# Patient Record
Sex: Female | Born: 1995 | Race: Black or African American | Hispanic: No | Marital: Single | Smoking: Never smoker
Health system: Southern US, Community
[De-identification: ages and names within clinical notes are randomized; demographics above are authoritative.]

## PROBLEM LIST (undated history)

## (undated) ENCOUNTER — Inpatient Hospital Stay (HOSPITAL_COMMUNITY): Payer: Self-pay

## (undated) DIAGNOSIS — R51 Headache: Secondary | ICD-10-CM

## (undated) DIAGNOSIS — R519 Headache, unspecified: Secondary | ICD-10-CM

## (undated) HISTORY — DX: Headache: R51

## (undated) HISTORY — PX: NO PAST SURGERIES: SHX2092

## (undated) HISTORY — DX: Headache, unspecified: R51.9

---

## 2014-08-15 ENCOUNTER — Emergency Department (HOSPITAL_COMMUNITY)
Admission: EM | Admit: 2014-08-15 | Discharge: 2014-08-16 | Disposition: A | Discharge status: Home / Self Care | Payer: Medicaid Other | Attending: Emergency Medicine | Admitting: Emergency Medicine

## 2014-08-15 ENCOUNTER — Encounter (HOSPITAL_COMMUNITY): Payer: Self-pay | Admitting: Emergency Medicine

## 2014-08-15 DIAGNOSIS — R202 Paresthesia of skin: Secondary | ICD-10-CM | POA: Insufficient documentation

## 2014-08-15 DIAGNOSIS — R002 Palpitations: Secondary | ICD-10-CM | POA: Insufficient documentation

## 2014-08-15 DIAGNOSIS — Z720 Tobacco use: Secondary | ICD-10-CM | POA: Insufficient documentation

## 2014-08-15 DIAGNOSIS — Z3202 Encounter for pregnancy test, result negative: Secondary | ICD-10-CM | POA: Insufficient documentation

## 2014-08-15 DIAGNOSIS — E669 Obesity, unspecified: Secondary | ICD-10-CM | POA: Insufficient documentation

## 2014-08-15 DIAGNOSIS — J029 Acute pharyngitis, unspecified: Secondary | ICD-10-CM | POA: Insufficient documentation

## 2014-08-15 LAB — URINALYSIS, ROUTINE W REFLEX MICROSCOPIC
BILIRUBIN URINE: NEGATIVE
GLUCOSE, UA: NEGATIVE mg/dL
HGB URINE DIPSTICK: NEGATIVE
KETONES UR: NEGATIVE mg/dL
Nitrite: NEGATIVE
PROTEIN: NEGATIVE mg/dL
Specific Gravity, Urine: 1.022 (ref 1.005–1.030)
Urobilinogen, UA: 1 mg/dL (ref 0.0–1.0)
pH: 5.5 (ref 5.0–8.0)

## 2014-08-15 LAB — CBC WITH DIFFERENTIAL/PLATELET
Basophils Absolute: 0 10*3/uL (ref 0.0–0.1)
Basophils Relative: 0 % (ref 0–1)
EOS ABS: 0 10*3/uL (ref 0.0–0.7)
Eosinophils Relative: 0 % (ref 0–5)
HCT: 40.6 % (ref 36.0–46.0)
HEMOGLOBIN: 13.4 g/dL (ref 12.0–15.0)
LYMPHS ABS: 2.2 10*3/uL (ref 0.7–4.0)
Lymphocytes Relative: 16 % (ref 12–46)
MCH: 30.7 pg (ref 26.0–34.0)
MCHC: 33 g/dL (ref 30.0–36.0)
MCV: 93.1 fL (ref 78.0–100.0)
MONOS PCT: 6 % (ref 3–12)
Monocytes Absolute: 0.8 10*3/uL (ref 0.1–1.0)
NEUTROS PCT: 78 % — AB (ref 43–77)
Neutro Abs: 10.6 10*3/uL — ABNORMAL HIGH (ref 1.7–7.7)
Platelets: 316 10*3/uL (ref 150–400)
RBC: 4.36 MIL/uL (ref 3.87–5.11)
RDW: 13.3 % (ref 11.5–15.5)
WBC: 13.6 10*3/uL — ABNORMAL HIGH (ref 4.0–10.5)

## 2014-08-15 LAB — COMPREHENSIVE METABOLIC PANEL
ALT: 19 U/L (ref 14–54)
AST: 19 U/L (ref 15–41)
Albumin: 3.7 g/dL (ref 3.5–5.0)
Alkaline Phosphatase: 94 U/L (ref 38–126)
Anion gap: 10 (ref 5–15)
BUN: 12 mg/dL (ref 6–20)
CALCIUM: 9.1 mg/dL (ref 8.9–10.3)
CO2: 24 mmol/L (ref 22–32)
Chloride: 103 mmol/L (ref 101–111)
Creatinine, Ser: 0.93 mg/dL (ref 0.44–1.00)
GFR calc Af Amer: >60 mL/min (ref >60)
GFR calc non Af Amer: >60 mL/min (ref >60)
GLUCOSE: 90 mg/dL (ref 65–99)
POTASSIUM: 4 mmol/L (ref 3.5–5.1)
Sodium: 137 mmol/L (ref 135–145)
TOTAL PROTEIN: 6.9 g/dL (ref 6.5–8.1)
Total Bilirubin: 0.4 mg/dL (ref 0.3–1.2)

## 2014-08-15 LAB — URINE MICROSCOPIC-ADD ON

## 2014-08-15 LAB — POC URINE PREG, ED: Preg Test, Ur: NEGATIVE

## 2014-08-15 MED ORDER — DEXAMETHASONE SODIUM PHOSPHATE 10 MG/ML IJ SOLN
10.0000 mg | Freq: Once | INTRAMUSCULAR | Status: DC
  Filled 2014-08-15: qty 1

## 2014-08-15 MED ORDER — ACETAMINOPHEN 325 MG PO TABS
650.0000 mg | ORAL_TABLET | Freq: Once | ORAL | Status: AC
  Administered 2014-08-16: 650 mg via ORAL
  Filled 2014-08-15: qty 2

## 2014-08-15 MED ORDER — METOCLOPRAMIDE HCL 5 MG/ML IJ SOLN
10.0000 mg | Freq: Once | INTRAMUSCULAR | Status: DC
  Filled 2014-08-15: qty 2

## 2014-08-15 MED ORDER — KETOROLAC TROMETHAMINE 30 MG/ML IJ SOLN
30.0000 mg | Freq: Once | INTRAMUSCULAR | Status: DC
  Filled 2014-08-15: qty 1

## 2014-08-15 NOTE — ED Notes (Signed)
Pt. reports dizziness/lightheaded with left upper arm tingling and mild blurred vision  onset today , denies fever or chills, no nausea or vomitting .

## 2014-08-15 NOTE — ED Provider Notes (Signed)
CSN: [id number]     Arrival date & time 08/15/14  2210 History   First MD Initiated Contact with Patient 08/15/14 2306     Chief Complaint  Patient presents with  . Dizziness  . Tingling     (Consider location/radiation/quality/duration/timing/severity/associated sxs/prior Treatment) Patient is a 19 y.o. female presenting with dizziness. The history is provided by the patient and a parent. No language interpreter was used.  Dizziness Associated symptoms: headaches   Associated symptoms: no chest pain   Shelly Watts is a 19 y.o female who presents with a headache and left arm tingling that began at 8:42pm while at work tonight.  She states that her throat hurts when she swallows. Mom states that she was complaining of her heart beating fast after work also.  She says her symptoms have since resolved but she still has a slight headache.  No prior treatment. She is not on birth control.  She denies any cough, shortness of breath, chest pain, abdominal pain, nausea, vomiting, or leg swelling.   History reviewed. No pertinent past medical history. History reviewed. No pertinent past surgical history. No family history on file. History  Substance Use Topics  . Smoking status: Current Some Day Smoker  . Smokeless tobacco: Not on file  . Alcohol Use: Yes   OB History    No data available     Review of Systems  HENT: Positive for sore throat.   Respiratory: Negative for cough.   Cardiovascular: Negative for chest pain.  Neurological: Positive for dizziness and headaches. Negative for syncope and light-headedness.  All other systems reviewed and are negative.     Allergies  Review of patient's allergies indicates no known allergies.  Home Medications   Prior to Admission medications   Not on File   BP 134/77 mmHg  Pulse 104  Temp(Src) 100.3 F (37.9 C) (Oral)  Resp 14  SpO2 100%  LMP 08/04/2014 Physical Exam  Constitutional: She is oriented to person, place, and time.  She appears well-developed and well-nourished.  Obese  HENT:  Mouth/Throat: Uvula is midline, oropharynx is clear and moist and mucous membranes are normal. No uvula swelling.  Bilateral tonsillar edema and erythema.  No kissing tonsils. No tonsillar abscess or exudates.  Eyes: Conjunctivae are normal.  Neck: Normal range of motion. Neck supple.  Cardiovascular: Normal rate and regular rhythm.   Pulmonary/Chest: Effort normal and breath sounds normal. No respiratory distress. She has no wheezes. She has no rales.  Abdominal: Soft. There is no tenderness.  Musculoskeletal: Normal range of motion.  Neurological: She is alert and oriented to person, place, and time. She has normal strength. No sensory deficit. GCS eye subscore is 4. GCS verbal subscore is 5. GCS motor subscore is 6.  Skin: Skin is warm and dry.    ED Course  Procedures (including critical care time) Labs Review Labs Reviewed  CBC WITH DIFFERENTIAL/PLATELET - Abnormal; Notable for the following:    WBC 13.6 (*)    Neutrophils Relative % 78 (*)    Neutro Abs 10.6 (*)    All other components within normal limits  URINALYSIS, ROUTINE W REFLEX MICROSCOPIC - Abnormal; Notable for the following:    APPearance CLOUDY (*)    Leukocytes, UA TRACE (*)    All other components within normal limits  URINE MICROSCOPIC-ADD ON - Abnormal; Notable for the following:    Squamous Epithelial / LPF FEW (*)    All other components within normal limits  URINE CULTURE  COMPREHENSIVE METABOLIC PANEL  TROPONIN I  POC URINE PREG, ED    Imaging Review No results found.   EKG Interpretation   Date/Time:  Thursday Aug 16 2014 00:24:09 EDT Ventricular Rate:  92 PR Interval:  140 QRS Duration: 83 QT Interval:  327 QTC Calculation: 404 R Axis:   6 Text Interpretation:  Sinus rhythm Low voltage, precordial leads nopt  Confirmed by MILLER  MD, Shelly ([id number]) on 08/16/2014 12:31:55 AM      MDM   Final diagnoses:  Viral pharyngitis   Palpitations  Patient presents for headache, palpitations, and left arm tingling while at work tonight.  She states her symptoms resolved soon after but mom was worried and brought her to the ED. Patient refused headache medications.  Her temp is 100.3 and she has a heart rate of 104.  Her labs are unremarkable and her EKG is not concerning. She is not dizzy now.  Patient is not drooling. No tonsillar abscess or hot potato voice. She non toxic appearing. I reviewed the CENTOR criteria.  I do not think she needs antibiotics. This is most likely viral. She can take tylenol for fever. I have given her [address] and wellness for follow up, patient and mom agree with plan.     Catha GosselinHanna Patel-Mills, PA-C 08/16/14 0036  Eber HongBrian Miller, MD 08/16/14 1026

## 2014-08-16 LAB — TROPONIN I: Troponin I: <0.03 ng/mL (ref <0.031)

## 2014-08-16 NOTE — ED Provider Notes (Signed)
The patient is a 19 year old female, reports having some palpitations earlier, dizziness earlier, denies any symptoms at this time, states that she feels totally normal. EKG is unremarkable, labs unremarkable, the patient was informed of these results. Her heart and lung sounds are normal, there is no abnormal palpitations or arrhythmia, clear lung sounds, well-appearing.   EKG Interpretation  Date/Time:  Thursday Aug 16 2014 00:24:09 EDT Ventricular Rate:  92 PR Interval:  140 QRS Duration: 83 QT Interval:  327 QTC Calculation: 404 R Axis:   6 Text Interpretation:  Sinus rhythm Low voltage, precordial leads nopt Confirmed by Sailor Hevia  MD, Vernie Piet ([id number]) on 08/16/2014 12:31:55 AM       Medical screening examination/treatment/procedure(s) were conducted as a shared visit with non-physician practitioner(s) and myself.  I personally evaluated the patient during the encounter.  Clinical Impression:   Final diagnoses:  Viral pharyngitis  Palpitations         Eber HongBrian Malissa Slay, MD 08/16/14 1027

## 2014-08-16 NOTE — ED Notes (Signed)
Pt refusing IV and IV medications. PA-C notified.

## 2014-08-16 NOTE — Discharge Instructions (Signed)

## 2014-08-17 LAB — URINE CULTURE: Colony Count: 35000

## 2015-12-10 ENCOUNTER — Ambulatory Visit: Payer: Self-pay | Admitting: Certified Nurse Midwife

## 2016-03-30 NOTE — L&D Delivery Note (Signed)
Delivery Note Shelly Watts is a 21 y.o. G1P1001 at 2177w4d admitted for advanced labor, 10cm in MAU/transferred to L&D.  Labor course: reports labor 'x 3 days', came in earlier today for labor eval, was 4cm and not making change so d/c'd home. States SROM thick mec app 1hr prior to arrival.  On 02/02/17 at 2328 a viable female was delivered via spontaneous vaginal delivery (Presentation: LOA) by Dr. Gwenevere GhaziKeriann Minott under my direct supervision.  Infant placed directly on mom's abdomen for bonding/skin-to-skin. Delayed cord clamping x 1min, then cord clamped x 2, and cut by pt's mom.  APGAR:9/10; weight: pending at time of note.  40 units of pitocin diluted in 1000cc LR was infused rapidly IV per protocol. The placenta separated spontaneously and delivered via CCT and maternal pushing effort.  It was inspected and appears to be intact with a 3 VC.  Placenta/Cord with the following complications: none .  Cord pH: not done  Intrapartum complications:  Precipitous delivery Anesthesia:  Local for repair Episiotomy: none Lacerations:  2nd degree perineal Suture Repair: 3.0 vicryl by me Est. Blood Loss (mL): 150ml Sponge and instrument count were correct x2.  Mom to postpartum.  Baby to Couplet care / Skin to Skin. Placenta to L&D. Plans to breastfeed Contraception: Nexplanon Circ: n/a  Marge DuncansBooker, Benno Brensinger Randall CNM, [facility] IncWHNP-BC 02/03/2017 12:58 AM   Grace BushyBooker, Merlene LaughterKimberly R, CNM  P [facility]        Please schedule this patient for PP visit in: 4 weeks  Low risk pregnancy complicated by: none  Delivery mode: SVD  Anticipated Birth Control: Nexplanon  PP Procedures needed: none  Schedule Integrated BH visit: no  Provider: Any provider

## 2016-08-09 ENCOUNTER — Inpatient Hospital Stay (HOSPITAL_COMMUNITY)
Admission: AD | Admit: 2016-08-09 | Discharge: 2016-08-10 | Disposition: A | Discharge status: Home / Self Care | Payer: Medicaid Other | Source: Ambulatory Visit | Attending: Obstetrics & Gynecology | Admitting: Obstetrics & Gynecology

## 2016-08-09 ENCOUNTER — Encounter (HOSPITAL_COMMUNITY): Payer: Self-pay

## 2016-08-09 DIAGNOSIS — Z3A14 14 weeks gestation of pregnancy: Secondary | ICD-10-CM | POA: Insufficient documentation

## 2016-08-09 DIAGNOSIS — R109 Unspecified abdominal pain: Secondary | ICD-10-CM | POA: Insufficient documentation

## 2016-08-09 DIAGNOSIS — O26892 Other specified pregnancy related conditions, second trimester: Secondary | ICD-10-CM | POA: Insufficient documentation

## 2016-08-09 DIAGNOSIS — O26899 Other specified pregnancy related conditions, unspecified trimester: Secondary | ICD-10-CM

## 2016-08-09 LAB — URINALYSIS, ROUTINE W REFLEX MICROSCOPIC
BILIRUBIN URINE: NEGATIVE
Glucose, UA: NEGATIVE mg/dL
HGB URINE DIPSTICK: NEGATIVE
Ketones, ur: NEGATIVE mg/dL
Leukocytes, UA: NEGATIVE
Nitrite: NEGATIVE
Protein, ur: NEGATIVE mg/dL
Specific Gravity, Urine: 1.018 (ref 1.005–1.030)
pH: 6 (ref 5.0–8.0)

## 2016-08-09 LAB — POCT PREGNANCY, URINE: Preg Test, Ur: POSITIVE — AB

## 2016-08-09 NOTE — MAU Note (Signed)
Pt here with abdominal pain for last two days, denies bleeding. Positive HPT two weeks. ago

## 2016-08-10 DIAGNOSIS — R109 Unspecified abdominal pain: Secondary | ICD-10-CM

## 2016-08-10 DIAGNOSIS — O26899 Other specified pregnancy related conditions, unspecified trimester: Secondary | ICD-10-CM

## 2016-08-10 MED ORDER — PRENATAL VITAMIN 27-0.8 MG PO TABS: 1.0000 | ORAL_TABLET | Freq: Every day | ORAL | 5 refills | Status: AC

## 2016-08-10 NOTE — Discharge Instructions (Signed)
Abdominal Pain During Pregnancy °Belly (abdominal) pain is common during pregnancy. Most of the time, it is not a serious problem. Other times, it can be a sign that something is wrong with the pregnancy. Always tell your doctor if you have belly pain. °Follow these instructions at home: °Monitor your belly pain for any changes. The following actions may help you feel better: °· Do not have sex (intercourse) or put anything in your vagina until you feel better. °· Rest until your pain stops. °· Drink clear fluids if you feel sick to your stomach (nauseous). Do not eat solid food until you feel better. °· Only take medicine as told by your doctor. °· Keep all doctor visits as told. °Get help right away if: °· You are bleeding, leaking fluid, or pieces of tissue come out of your vagina. °· You have more pain or cramping. °· You keep throwing up (vomiting). °· You have pain when you pee (urinate) or have blood in your pee. °· You have a fever. °· You do not feel your baby moving as much. °· You feel very weak or feel like passing out. °· You have trouble breathing, with or without belly pain. °· You have a very bad headache and belly pain. °· You have fluid leaking from your vagina and belly pain. °· You keep having watery poop (diarrhea). °· Your belly pain does not go away after resting, or the pain gets worse. °This information is not intended to replace advice given to you by your health care provider. Make sure you discuss any questions you have with your health care provider. °Document Released: 03/04/2009 Document Revised: 10/23/2015 Document Reviewed: 10/13/2012 °Elsevier Interactive Patient Education © 2017 [employer] ° °SAFE MEDICATIONS IN PREGNANCY ° °Acne:  °Benzoyl Peroxide  °Salicylic Acid  ° °Backache/Headache:  °Tylenol: 2 regular strength every 4 hours OR  °             2 Extra strength every 6 hours  ° °Colds/Coughs/Allergies:  °Benadryl (alcohol free) 25 mg every 6 hours as needed  °Breath right  strips  °Claritin  °Cepacol throat lozenges  °Chloraseptic throat spray  °Cold-Eeze- up to three times per day  °Cough drops, alcohol free  °Flonase (by prescription only)  °Guaifenesin  °Mucinex  °Robitussin DM (plain only, alcohol free)  °Saline nasal spray/drops  °Sudafed (pseudoephedrine) & Actifed * use only after [redacted] weeks gestation and if you do not have high blood pressure  °Tylenol  °Vicks Vaporub  °Zinc lozenges  °Zyrtec  ° °Constipation:  °Colace  °Ducolax suppositories  °Fleet enema  °Glycerin suppositories  °Metamucil  °Milk of magnesia  °Miralax  °Senokot  °Smooth move tea  ° °Diarrhea:  °Kaopectate  °Imodium A-D  ° °*NO pepto Bismol  ° °Hemorrhoids:  °Anusol  °Anusol HC  °Preparation H  °Tucks  ° °Indigestion:  °Tums  °Maalox  °Mylanta  °Zantac  °Pepcid  ° °Insomnia:  °Benadryl (alcohol free) 25mg every 6 hours as needed  °Tylenol PM  °Unisom, no Gelcaps  ° °Leg Cramps:  °Tums  °MagGel  ° °Nausea/Vomiting:  °Bonine  °Dramamine  °Emetrol  °Ginger extract  °Sea bands  °Meclizine  °Nausea medication to take during pregnancy:  °Unisom (doxylamine succinate 25 mg tablets) Take one tablet daily at bedtime. If symptoms are not adequately controlled, the dose can be increased to a maximum recommended dose of two tablets daily (1/2 tablet in the morning, 1/2 tablet mid-afternoon and one at bedtime).  °Vitamin B6 100mg tablets. Take one tablet twice a day (  up to 200 mg per day).   Skin Rashes:  Aveeno products  Benadryl cream or 25mg  every 6 hours as needed  Calamine Lotion  1% cortisone cream   Yeast infection:  Gyne-lotrimin 7  Monistat 7    **If taking multiple medications, please check labels to avoid duplicating the same active ingredients  **take medication as directed on the label  ** Do not exceed 4000 mg of tylenol in 24 hours  **Do not take medications that contain aspirin or ibuprofen

## 2016-08-10 NOTE — MAU Provider Note (Signed)
Chief Complaint: Abdominal Pain   SUBJECTIVE HPI: Shelly Watts is a 21 y.o. G1P0 at 343w2d who presents to Maternity Admissions reporting abdominal cramping while pregnant.  Started 3 days ago. Pain is described as cramping (like menstrual cramps), only located on left side, without any sharp or severe pain. Lasts a couple minutes, comes/goes within seconds, nothing brings it on. Denies VB. +Nausea but not vomiting, tolerating PO intake. Denies fevers, CP/SOB.  Not drinking a lot of water, mostly drinking juice and sodas. Having BMs daily, soft, no diarrhea. Denies any abnormal vaginal discharge.  Just found out she was pregnant [redacted] weeks ago, wasn't sure if she was actually pregnant, which is why she came to MAU. Has not started taking prenatal vitamins yet. Has not seen an OB yet.  LMP: Beginning of Feb, has irregular periods (does get them monthly, but just not exactly 28 days).     Past Medical History:  Diagnosis Date  . Medical history non-contributory    OB History  Gravida Para Term Preterm AB Living  1            SAB TAB Ectopic Multiple Live Births               # Outcome Date GA Lbr Len/2nd Weight Sex Delivery Anes PTL Lv  1 Current              Past Surgical History:  Procedure Laterality Date  . NO PAST SURGERIES     Social History   Social History  . Marital status: Single    Spouse name: N/A  . Number of children: N/A  . Years of education: N/A   Occupational History  . Not on file.   Social History Main Topics  . Smoking status: Never Smoker  . Smokeless tobacco: Never Used  . Alcohol use No     Comment: none since posiive  pregnancy test  . Drug use: No  . Sexual activity: Not on file   Other Topics Concern  . Not on file   Social History Narrative  . No narrative on file   No current facility-administered medications on file prior to encounter.    No current outpatient prescriptions on file prior to encounter.   No Known Allergies  I  have reviewed the past Medical Hx, Surgical Hx, Social Hx, Allergies and Medications.   REVIEW OF SYSTEMS  A comprehensive ROS was negative except per HPI.   OBJECTIVE Patient Vitals for the past 24 hrs:  BP Temp Temp src Pulse Resp SpO2 Height Weight  08/09/16 2312 119/74 98.5 F (36.9 C) Oral 91 18 99 % 5' (1.524 m) 236 lb (107 kg)    PHYSICAL EXAM Constitutional: Well-developed, well-nourished female in no acute distress.  Cardiovascular: normal rate, rhythm, no murmurs Respiratory: normal rate and effort. CTAB GI: Abd soft, non-tender, non-distended. Could not reproduce the pain. Patient pointed to where her pain has been located, pointed in the LLQ, not pelvic area. Pos BS x 4 MS: Extremities nontender, no edema, normal ROM Neurologic: Alert and oriented x 4.  GU: Neg CVAT. BIMANUAL: cervix closed/LONG/thick; uterus normal size, no adnexal tenderness or masses. No CMT.  LAB RESULTS Results for orders placed or performed during the hospital encounter of 08/09/16 (from the past 24 hour(s))  Urinalysis, Routine w reflex microscopic     Status: Abnormal   Collection Time: 08/09/16 11:15 PM  Result Value Ref Range   Color, Urine YELLOW YELLOW   APPearance HAZY (A)  CLEAR   Specific Gravity, Urine 1.018 1.005 - 1.030   pH 6.0 5.0 - 8.0   Glucose, UA NEGATIVE NEGATIVE mg/dL   Hgb urine dipstick NEGATIVE NEGATIVE   Bilirubin Urine NEGATIVE NEGATIVE   Ketones, ur NEGATIVE NEGATIVE mg/dL   Protein, ur NEGATIVE NEGATIVE mg/dL   Nitrite NEGATIVE NEGATIVE   Leukocytes, UA NEGATIVE NEGATIVE  Pregnancy, urine POC     Status: Abnormal   Collection Time: 08/09/16 11:37 PM  Result Value Ref Range   Preg Test, Ur POSITIVE (A) NEGATIVE    IMAGING No results found.  MAU COURSE SVE- closes/LONG/thick, normal white vaginal discharge on glove Doppler FHT 150s No pain on exam VSS UA clean Pos UPT   MDM Plan of care reviewed with patient, including labs and tests ordered and  medical treatment. Discussed to start taking prenatal vitamins, increase fluid intake and drink less soda and juice and more water. Warning signs/symptoms of miscarriage given. Likely cramping is due to bowel issues.    ASSESSMENT 1. Abdominal cramping affecting pregnancy     PLAN Discharge home in stable condition. Precautions for miscarriage given Follow up soon with OB of choice  Follow-up Information    OB Provider of your choice. Schedule an appointment as soon as possible for a visit in 1 week(s).   Why:  First prenatal visit Contact information: Please see handout of OB/GYN in the area         Allergies as of 08/10/2016   No Known Allergies     Medication List    TAKE these medications   Prenatal Vitamin 27-0.8 MG Tabs Take 1 tablet by mouth daily.        Jen Mow, DO OB Fellow 08/10/2016 1:15 AM

## 2016-09-17 ENCOUNTER — Ambulatory Visit: Payer: Self-pay | Admitting: Obstetrics

## 2016-10-02 ENCOUNTER — Encounter: Payer: Self-pay | Admitting: Obstetrics

## 2016-10-02 ENCOUNTER — Other Ambulatory Visit (HOSPITAL_COMMUNITY)
Admission: RE | Admit: 2016-10-02 | Discharge: 2016-10-02 | Disposition: A | Discharge status: Home / Self Care | Payer: Medicaid Other | Source: Ambulatory Visit | Attending: Obstetrics | Admitting: Obstetrics

## 2016-10-02 ENCOUNTER — Ambulatory Visit (INDEPENDENT_AMBULATORY_CARE_PROVIDER_SITE_OTHER): Payer: Medicaid Other | Admitting: Obstetrics

## 2016-10-02 VITALS — BP 121/74 | HR 95 | Temp 98.3°F | Wt 249.9 lb

## 2016-10-02 DIAGNOSIS — Z6841 Body Mass Index (BMI) 40.0 and over, adult: Secondary | ICD-10-CM

## 2016-10-02 DIAGNOSIS — Z3401 Encounter for supervision of normal first pregnancy, first trimester: Secondary | ICD-10-CM

## 2016-10-02 DIAGNOSIS — Z3400 Encounter for supervision of normal first pregnancy, unspecified trimester: Secondary | ICD-10-CM | POA: Insufficient documentation

## 2016-10-02 DIAGNOSIS — Z3402 Encounter for supervision of normal first pregnancy, second trimester: Secondary | ICD-10-CM | POA: Insufficient documentation

## 2016-10-02 DIAGNOSIS — O0930 Supervision of pregnancy with insufficient antenatal care, unspecified trimester: Secondary | ICD-10-CM

## 2016-10-02 NOTE — Progress Notes (Signed)
Subjective:    Shelly Watts is being seen today for her first obstetrical visit.  This is not a planned pregnancy. She is at 4052w6d gestation. Her obstetrical history is significant for obesity. Relationship with FOB: significant other, not living together. Patient does intend to breast feed. Pregnancy history fully reviewed.  The information documented in the HPI was reviewed and verified.  Menstrual History: OB History    Gravida Para Term Preterm AB Living   1 0           SAB TAB Ectopic Multiple Live Births                  Patient's last menstrual period was 05/02/2016 (approximate).    Past Medical History:  Diagnosis Date  . Headache   . Medical history non-contributory     Past Surgical History:  Procedure Laterality Date  . NO PAST SURGERIES       (Not in a hospital admission) No Known Allergies  Social History  Substance Use Topics  . Smoking status: Never Smoker  . Smokeless tobacco: Never Used  . Alcohol use No     Comment: none since posiive  pregnancy test    Family History  Problem Relation Age of Onset  . Miscarriages / [place] Mother   . Asthma Brother   . Heart disease Maternal Grandmother   . Hypertension Maternal Grandmother   . Diabetes Maternal Grandfather   . Hypertension Maternal Grandfather      Review of Systems Constitutional: negative for weight loss Gastrointestinal: negative for vomiting Genitourinary:negative for genital lesions and vaginal discharge and dysuria Musculoskeletal:negative for back pain Behavioral/Psych: negative for abusive relationship, depression, illegal drug usage and tobacco use    Objective:    BP 121/74   Pulse 95   Temp 98.3 F (36.8 C)   Wt 249 lb 14.4 oz (113.4 kg)   LMP 05/02/2016 (Approximate)   BMI 48.81 kg/m  General Appearance:    Alert, cooperative, no distress, appears stated age  Head:    Normocephalic, without obvious abnormality, atraumatic  Eyes:    PERRL, conjunctiva/corneas  clear, EOM's intact, fundi    benign, both eyes  Ears:    Normal TM's and external ear canals, both ears  Nose:   Nares normal, septum midline, mucosa normal, no drainage    or sinus tenderness  Throat:   Lips, mucosa, and tongue normal; teeth and gums normal  Neck:   Supple, symmetrical, trachea midline, no adenopathy;    thyroid:  no enlargement/tenderness/nodules; no carotid   bruit or JVD  Back:     Symmetric, no curvature, ROM normal, no CVA tenderness  Lungs:     Clear to auscultation bilaterally, respirations unlabored  Chest Wall:    No tenderness or deformity   Heart:    Regular rate and rhythm, S1 and S2 normal, no murmur, rub   or gallop  Breast Exam:    No tenderness, masses, or nipple abnormality  Abdomen:     Soft, non-tender, bowel sounds active all four quadrants,    no masses, no organomegaly  Genitalia:    Normal female without lesion, discharge or tenderness  Extremities:   Extremities normal, atraumatic, no cyanosis or edema  Pulses:   2+ and symmetric all extremities  Skin:   Skin color, texture, turgor normal, no rashes or lesions  Lymph nodes:   Cervical, supraclavicular, and axillary nodes normal  Neurologic:   CNII-XII intact, normal strength, sensation and reflexes  throughout      Lab Review Urine pregnancy test Labs reviewed yes Radiologic studies reviewed yes  Assessment:    Pregnancy at [redacted]w[redacted]d weeks    Plan:     1. Supervision of normal first pregnancy, antepartum Rx: - Culture, OB Urine - [place] MFM OB COMP + 14 WK; Future  2. Late prenatal care affecting pregnancy, antepartum   3. Class 3 severe obesity due to excess calories without serious comorbidity with body mass index (BMI) of 45.0 to 49.9 in adult [facility])  Prenatal vitamins.  Counseling provided regarding continued use of seat belts, cessation of alcohol consumption, smoking or use of illicit drugs; infection precautions i.e., influenza/TDAP immunizations, toxoplasmosis,CMV, parvovirus,  listeria and varicella; workplace safety, exercise during pregnancy; routine dental care, safe medications, sexual activity, hot tubs, saunas, pools, travel, caffeine use, fish and methlymercury, potential toxins, hair treatments, varicose veins Weight gain recommendations per IOM guidelines reviewed: underweight/BMI< 18.5--> gain 28 - 40 lbs; normal weight/BMI 18.5 - 24.9--> gain 25 - 35 lbs; overweight/BMI 25 - 29.9--> gain 15 - 25 lbs; obese/BMI >30->gain  11 - 20 lbs Problem list reviewed and updated. FIRST/CF mutation testing/NIPT/QUAD SCREEN/fragile X/Ashkenazi Jewish population testing/Spinal muscular atrophy discussed: requested. Role of ultrasound in pregnancy discussed; fetal survey: requested. Amniocentesis discussed: not indicated. VBAC calculator score: VBAC consent form provided No orders of the defined types were placed in this encounter.  Orders Placed This Encounter  Procedures  . Culture, OB Urine  . [place] MFM OB COMP + 14 WK    Standing Status:   Future    Standing Expiration Date:   12/03/2017    Order Specific Question:   Reason for Exam (SYMPTOM  OR DIAGNOSIS REQUIRED)    Answer:   Anatomy    Order Specific Question:   Preferred imaging location?    Answer:   [facility]-Ultrasound    Follow up in 4 weeks. 50% of 20 min visit spent on counseling and coordination of care. Patient ID: Shelly Monks, female   DOB: 1995/05/13, 21 y.o.   MRN: [id number]

## 2016-10-02 NOTE — Progress Notes (Signed)
New OB late to care at 21.6 weeks

## 2016-10-03 NOTE — Progress Notes (Signed)
Patient did not show for visit.  Shelly Nishida A. Clearance CootsHarper MD

## 2016-10-04 LAB — CULTURE, OB URINE

## 2016-10-04 LAB — URINE CULTURE, OB REFLEX

## 2016-10-05 LAB — HEMOGLOBINOPATHY EVALUATION
HEMOGLOBIN A2 QUANTITATION: 2.5 % (ref 1.8–3.2)
HEMOGLOBIN F QUANTITATION: 0 % (ref 0.0–2.0)
HGB A: 97.5 % (ref 96.4–98.8)
HGB C: 0 %
HGB S: 0 %
HGB VARIANT: 0 %

## 2016-10-05 LAB — OBSTETRIC PANEL, INCLUDING HIV
Antibody Screen: NEGATIVE
Basophils Absolute: 0 10*3/uL (ref 0.0–0.2)
Basos: 0 %
EOS (ABSOLUTE): 0.1 10*3/uL (ref 0.0–0.4)
Eos: 1 %
HEMOGLOBIN: 12.2 g/dL (ref 11.1–15.9)
HIV Screen 4th Generation wRfx: NONREACTIVE
Hematocrit: 36.7 % (ref 34.0–46.6)
Hepatitis B Surface Ag: NEGATIVE
IMMATURE GRANS (ABS): 0 10*3/uL (ref 0.0–0.1)
IMMATURE GRANULOCYTES: 0 %
LYMPHS: 19 %
Lymphocytes Absolute: 1.6 10*3/uL (ref 0.7–3.1)
MCH: 31.3 pg (ref 26.6–33.0)
MCHC: 33.2 g/dL (ref 31.5–35.7)
MCV: 94 fL (ref 79–97)
MONOCYTES: 8 %
MONOS ABS: 0.6 10*3/uL (ref 0.1–0.9)
Neutrophils Absolute: 6 10*3/uL (ref 1.4–7.0)
Neutrophils: 72 %
Platelets: 274 10*3/uL (ref 150–379)
RBC: 3.9 x10E6/uL (ref 3.77–5.28)
RDW: 14.5 % (ref 12.3–15.4)
RPR Ser Ql: NONREACTIVE
Rh Factor: POSITIVE
Rubella Antibodies, IGG: 2.35 index (ref >0.99)
WBC: 8.3 10*3/uL (ref 3.4–10.8)

## 2016-10-05 LAB — CERVICOVAGINAL ANCILLARY ONLY
Bacterial vaginitis: NEGATIVE
Candida vaginitis: NEGATIVE
Chlamydia: NEGATIVE
Neisseria Gonorrhea: NEGATIVE
TRICH (WINDOWPATH): NEGATIVE

## 2016-10-05 LAB — VARICELLA ZOSTER ANTIBODY, IGG: VARICELLA: 210 {index} (ref >165)

## 2016-10-06 LAB — CYTOLOGY - PAP

## 2016-10-08 LAB — AFP TETRA
DIA MOM VALUE: 0.5
DIA Value (EIA): 95.53 pg/mL
DSR (By Age)    1 IN: 1124
DSR (SECOND TRIMESTER) 1 IN: 10000
Gestational Age: 21.6 WEEKS
MSAFP Mom: 1.23
MSAFP: 70.7 ng/mL
MSHCG Mom: 1.31
MSHCG: 22063 m[IU]/mL
Maternal Age At EDD: 22.2 yr
OSB RISK: 10000
T18 (By Age): 1:4377 {titer}
Test Results:: NEGATIVE
UE3 MOM: 1.65
WEIGHT: 241 [lb_av]
uE3 Value: 3.28 ng/mL

## 2016-10-08 LAB — CYSTIC FIBROSIS MUTATION 97

## 2016-10-09 ENCOUNTER — Other Ambulatory Visit: Payer: Self-pay | Admitting: Obstetrics

## 2016-10-09 DIAGNOSIS — Z3A23 23 weeks gestation of pregnancy: Secondary | ICD-10-CM

## 2016-10-09 DIAGNOSIS — Z3689 Encounter for other specified antenatal screening: Secondary | ICD-10-CM

## 2016-10-09 DIAGNOSIS — O99212 Obesity complicating pregnancy, second trimester: Secondary | ICD-10-CM

## 2016-10-12 ENCOUNTER — Ambulatory Visit (HOSPITAL_COMMUNITY)
Admission: RE | Admit: 2016-10-12 | Discharge: 2016-10-12 | Disposition: A | Discharge status: Home / Self Care | Payer: Medicaid Other | Source: Ambulatory Visit | Attending: Obstetrics | Admitting: Obstetrics

## 2016-10-12 DIAGNOSIS — O99212 Obesity complicating pregnancy, second trimester: Secondary | ICD-10-CM | POA: Diagnosis present

## 2016-10-12 DIAGNOSIS — Z3689 Encounter for other specified antenatal screening: Secondary | ICD-10-CM | POA: Diagnosis not present

## 2016-10-12 DIAGNOSIS — Z3A23 23 weeks gestation of pregnancy: Secondary | ICD-10-CM | POA: Insufficient documentation

## 2016-10-13 ENCOUNTER — Other Ambulatory Visit: Payer: Self-pay | Admitting: Obstetrics

## 2016-10-13 DIAGNOSIS — Z13228 Encounter for screening for other metabolic disorders: Secondary | ICD-10-CM

## 2016-10-30 ENCOUNTER — Ambulatory Visit (INDEPENDENT_AMBULATORY_CARE_PROVIDER_SITE_OTHER): Payer: Self-pay | Admitting: Obstetrics

## 2016-10-30 ENCOUNTER — Encounter: Payer: Self-pay | Admitting: *Deleted

## 2016-10-30 ENCOUNTER — Encounter: Payer: Self-pay | Admitting: Obstetrics

## 2016-10-30 DIAGNOSIS — Z3402 Encounter for supervision of normal first pregnancy, second trimester: Secondary | ICD-10-CM

## 2016-10-30 DIAGNOSIS — Z3400 Encounter for supervision of normal first pregnancy, unspecified trimester: Secondary | ICD-10-CM

## 2016-10-30 NOTE — Progress Notes (Signed)
Patient reports good fetal movement, denies pain. 

## 2016-10-30 NOTE — Progress Notes (Signed)
Subjective:  Shelly Watts is a 21 y.o. G1P0 at 5233w6d being seen today for ongoing prenatal care.  She is currently monitored for the following issues for this low-risk pregnancy and has Supervision of normal first pregnancy, antepartum on her problem list.  Patient reports no complaints.  Contractions: Not present. Vag. Bleeding: None.  Movement: Present. Denies leaking of fluid.   The following portions of the patient's history were reviewed and updated as appropriate: allergies, current medications, past family history, past medical history, past social history, past surgical history and problem list. Problem list updated.  Objective:   Vitals:   10/30/16 1041  BP: 104/70  Pulse: 85    Fetal Status: Fetal Heart Rate (bpm): 150   Movement: Present     General:  Alert, oriented and cooperative. Patient is in no acute distress.  Skin: Skin is warm and dry. No rash noted.   Cardiovascular: Normal heart rate noted  Respiratory: Normal respiratory effort, no problems with respiration noted  Abdomen: Soft, gravid, appropriate for gestational age. Pain/Pressure: Absent     Pelvic:  Cervical exam deferred        Extremities: Normal range of motion.  Edema: Trace  Mental Status: Normal mood and affect. Normal behavior. Normal judgment and thought content.   Urinalysis:      Assessment and Plan:  Pregnancy: G1P0 at 6033w6d  1. Supervision of normal first pregnancy, antepartum   Preterm labor symptoms and general obstetric precautions including but not limited to vaginal bleeding, contractions, leaking of fluid and fetal movement were reviewed in detail with the patient. Please refer to After Visit Summary for other counseling recommendations.  Return in about 2 weeks (around 11/13/2016) for ROB, 2 hour OGTT.   Brock BadHarper, Charles A, MD

## 2016-11-03 ENCOUNTER — Ambulatory Visit (HOSPITAL_COMMUNITY)
Admission: RE | Admit: 2016-11-03 | Discharge: 2016-11-03 | Disposition: A | Discharge status: Home / Self Care | Payer: Medicaid Other | Source: Ambulatory Visit | Attending: Obstetrics | Admitting: Obstetrics

## 2016-11-03 DIAGNOSIS — Z141 Cystic fibrosis carrier: Principal | ICD-10-CM

## 2016-11-03 DIAGNOSIS — Z3A26 26 weeks gestation of pregnancy: Secondary | ICD-10-CM | POA: Insufficient documentation

## 2016-11-03 DIAGNOSIS — O09899 Supervision of other high risk pregnancies, unspecified trimester: Secondary | ICD-10-CM | POA: Insufficient documentation

## 2016-11-03 NOTE — Progress Notes (Signed)
Genetic Counseling  High-Risk Gestation Note  Appointment Date:  11/03/2016 Referred By: Shelly Bombard, MD Date of Birth:  June 30, 1995   Pregnancy History: G1P0 Estimated Date of Delivery: 02/06/17 Estimated Gestational Age: 52w3dAttending: JJolyn Lent MD  I met with Ms. 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     In summary:  Discussed cystic fibrosis and autosomal recessive inheritance  Reviewed the specific variant identified in Ms. WDoylestownwith 7T/7T- range of consequences for patients with this variant in combination with another pathogenic variant  Discussed availability of carrier screening for the father of the baby  Reviewed limitations of screening  Father of baby not currently available for carrier screening  Reviewed risks to the fetus prior to carrier screening for the father of the baby  Discussed option of prenatal diagnosis only when mutations have been identified in both parents  Declined amniocentesis at this time  Understand limitation of ultrasound in diagnosis of cf  Patient is comfortable with CF assessment as part of newborn screening  No other family history concerns   We reviewed the results of Ms. Frankye WCaliforniaCF carrier screening.  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 CF carrier screening has not yet been performed for her the father of the pregnancy.  Ms. WSutphenreported no additional relatives known to be CF carriers and no known relatives with cystic fibrosis.  The father of the pregnancy reportedly has no known individuals with cystic fibrosis in his family history, and consanguinity to Ms. Thersia WCaliforniawas denied. He reportedly has [place], Caucasian, and Native American ancestry. Ms. WYokleyreported another potential father of the pregnancy  who also has no known family history of CF, no known consanguinity to her and has African American ancestry.   We spent time discussing cystic fibrosis (CF).  Classic features of CF include thickened secretions in the lungs, digestive and reproductive systems. This life-limiting condition is characterized by chronic respiratory infections requiring daily chest therapies and pancreatic dysfunction disrupting the body's ability to break down food and extract nutrients as it should, which may restrict growth. Infertility commonly occurs in males. With therapies, such as daily respiratory therapies and medications to aid digestion, the median lifespan for people with CF is now mid-40's. We discussed that more recent therapies include CFTR modulator therapies, which are medications designed to correct the function of the defective protein made by CFTR. Treatment may involve lung transplantation in some cases. There can be significant variability in the severity of symptoms and expression of the disease. Expression of the disease depends upon the specific mutations present in an individual with CF.   We spent time reviewing genes and the autosomal recessive inheritance of CF. CF is a common genetic condition in the Caucasian population occurring in approximately 1 in 358,300Caucasian births. This means approximately 1 in 29 Caucasians is a CF carrier, and approximately 1 in 638individuals of African American ancestry are CF carriers. We discussed that individuals who are carriers have one copy of the CFTR gene with a disease causing mutation, and their other CFTR gene copy functions correctly. Thus, carriers typically do not have associated medical symptoms. We discussed that when both parents are carriers for CF, each pregnancy has an independent chance for one of the following outcomes: a 25% chance to inherit both mutations and thus have CF;  a 50% chance to inherit one gene mutation and be a carrier similar to  parents; and a 25% chance to be neither a carrier nor have CF. When one parent is a CF carrier but the other is not, then each pregnancy has a 1 in 2 chance to be a CF carrier but would not be expected to be at increased risk to inherit CF.   We spent some time specifically discussing Shelly Watts's specific CF gene mutation. R117H is a gene mutation, which is known to produce a CFTR protein with reduced chloride transport. The intron 8 polythymidine (poly T) sequence in the gene influences the severity of the CF phenotype when R117H is found in conjunction with a severe CF mutation. The thymidines are found in sequences of 5(5T), 7 (7T), or 9 (9T) repeats. R117H on a 5T background is acknowledged as a disease producing CFTR mutation and in combination with a severe mutation (for example, [id number]) generally results in pancreatic sufficient CF. There is less known about R117H on a 7T background, which is the particular gene change identified for Shelly Watts. In one series, subjects were asymptomatic or males had absent vas deferens only. A more recent series of patients known to CF clinics through clinical presentation or new born screening included a small number of adults with adult onset CF disease. It is possible, however, that compound heterozygotes with R117H on a 7T background remain asymptomatic and are never tested. The range of possible phenotypes associated with the R117H mutation highlights the importance of obtaining the intron 8 polythymidine sequence prior to predicting the severity of the phenotype in an R117H compound heterozygote. The difficulty lies in the certainty of the information provided to parents and patients; in particular those with R117H on a 7T background (Curnow, 2003). Given that Shelly Watts has R117H/7T allele, in the case that the father of the pregnancy also has a CFTR mutation, there would be a 1 in 4 chance for the baby to inherit both alleles, but  that child would not be expected to have classic CF; the combination of a classic CFTR mutation with R117H; 7T is more likely to be associated with CBAVD (congenital bilateral absence of the vas deferens) in males, mild lung disease in some cases, or absence of symptoms.    There are known to be thousands of mutations which can cause the CFTR gene to not function properly. Carrier screening is available to assess for the most common disease causing mutations. However, carrier screening does not identify all CF carriers. Thus, a negative CF carrier screen would reduce, but not eliminate, the chance to be a CF carrier and thus the chance for CF in a pregnancy.  Detection rate for CF carrier screening depends in part upon the methodology of the lab. Given that the father of the pregnancy reportedly has no known family history of CF, he would have the general population chance to be a carrier, or approximately up to 1 in 29, prior to carrier screening. Thus, prior to carrier screening for the father of the pregnancy, the chance for an affected pregnancy is approximately 1 in 116 (0.9%).  We discussed that CF carrier screening for the father of the pregnancy would further refine the risk for CF in the current pregnancy. Shelly Watts reported that the father of the pregnancy is currently unavailable for CF carrier screening, given that he is incarcerated.   We reviewed that when both parents are identified to be CF carriers,  prenatal diagnosis via amniocentesis would be available, if desired. The risks, benefits, and limitations of amniocentesis were reviewed. Shelly Watts stated that she is not interested in amniocentesis in the pregnancy. A fetus with cystic fibrosis typically appears normal on targeted ultrasound, although rarely echogenic bowel is visualized. However, the presence of echogenic bowel on targeted ultrasound is not diagnostic for CF in a pregnancy, nor does the absence of echogenic bowel on  ultrasound rule out CF in the pregnancy. We discussed that postnatal testing for CF can also be performed for babies identified to be at risk to inherit CF. She understands that in [place], the newborn screening test will detect CF, but that carriers may come back as false positives.  Both family histories were reviewed and were otherwise contributory for cerebral palsy in the patient's maternal half-siblings; twin brother and sister. Shelly Watts reported that these twin half-siblings were born at [redacted] weeks gestation. The female twin has an unstable gait, and the female twin is not able to walk and has also required VP shunt.  Cerebral palsy (CP) is a group of clinical syndromes that range in severity, characterized by abnormal muscle tone, posture, and movement. Cerebral palsy is due to abnormalities in the developing brain resulting from a variety of causes. The etiology is reported to be multifactorial, with most cases typically due to prenatal factors.  Prematurity is the most common association, but in many cases, no cause is identified. A specific genetic cause for cerebral palsy has not been identified, and underlying genetic disorders are relatively uncommon in individuals with CP.  Given the reported family history, recurrence risk for the current pregnancy would likely be low. Additional information regarding this individual's underlying condition or etiology may alter recurrence risk assessment. Without further information regarding the provided family history, an accurate genetic risk cannot be calculated. Further genetic counseling is warranted if more information is obtained.  Shelly Watts Eagen denied exposure to environmental toxins or chemical agents. She denied the use of alcohol, tobacco or street drugs. She denied significant viral illnesses during the course of her pregnancy. Her medical and surgical histories were noncontributory.   I counseled Ms. Andrews regarding the  above risks and available options.  The approximate face-to-face time with the genetic counselor was 35 minutes.  Chipper Oman, MS Certified Genetic Counselor 11/03/2016

## 2016-11-25 ENCOUNTER — Encounter: Payer: Self-pay | Admitting: Obstetrics

## 2016-11-25 ENCOUNTER — Ambulatory Visit (INDEPENDENT_AMBULATORY_CARE_PROVIDER_SITE_OTHER): Payer: Medicaid Other | Admitting: Obstetrics

## 2016-11-25 ENCOUNTER — Other Ambulatory Visit: Payer: Medicaid Other

## 2016-11-25 VITALS — BP 110/73 | HR 94 | Wt 252.8 lb

## 2016-11-25 DIAGNOSIS — Z3400 Encounter for supervision of normal first pregnancy, unspecified trimester: Secondary | ICD-10-CM

## 2016-11-25 DIAGNOSIS — Z6841 Body Mass Index (BMI) 40.0 and over, adult: Secondary | ICD-10-CM

## 2016-11-25 DIAGNOSIS — O99213 Obesity complicating pregnancy, third trimester: Secondary | ICD-10-CM

## 2016-11-25 DIAGNOSIS — O0933 Supervision of pregnancy with insufficient antenatal care, third trimester: Secondary | ICD-10-CM

## 2016-11-25 DIAGNOSIS — O0930 Supervision of pregnancy with insufficient antenatal care, unspecified trimester: Secondary | ICD-10-CM

## 2016-11-25 NOTE — Progress Notes (Signed)
Subjective:  Shelly Watts is a 21 y.o. G1P0 at 7733w4d being seen today for ongoing prenatal care.  She is currently monitored for the following issues for this low-risk pregnancy and has Supervision of normal first pregnancy, antepartum; Cystic fibrosis carrier, antepartum; and [redacted] weeks gestation of pregnancy on her problem list.  Patient reports breast nipple pain and burning sensation.  Contractions: Not present. Vag. Bleeding: None.  Movement: Present. Denies leaking of fluid.   The following portions of the patient's history were reviewed and updated as appropriate: allergies, current medications, past family history, past medical history, past social history, past surgical history and problem list. Problem list updated.  Objective:   Vitals:   11/25/16 1058  BP: 110/73  Pulse: 94  Weight: 252 lb 12.8 oz (114.7 kg)    Fetal Status: Fetal Heart Rate (bpm): 150   Movement: Present     General:  Alert, oriented and cooperative. Patient is in no acute distress.  Skin: Skin is warm and dry. No rash noted.   Cardiovascular: Normal heart rate noted  Respiratory: Normal respiratory effort, no problems with respiration noted  Abdomen: Soft, gravid, appropriate for gestational age. Pain/Pressure: Absent     Pelvic:  Cervical exam deferred        Extremities: Normal range of motion.  Edema: None  Mental Status: Normal mood and affect. Normal behavior. Normal judgment and thought content.   Urinalysis:      Assessment and Plan:  Pregnancy: G1P0 at 6133w4d  1. Supervision of normal first pregnancy, antepartum   2. 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 Late prenatal care affecting pregnancy, antepartum   Preterm labor symptoms and general obstetric precautions including but not limited to vaginal bleeding, contractions, leaking of fluid and fetal movement were reviewed in detail with the  patient. Please refer to After Visit Summary for other counseling recommendations.  Return in about 2 weeks (around 12/09/2016) for ROB.   Brock BadHarper, Iori Gigante A, MD

## 2016-11-25 NOTE — Progress Notes (Signed)
Patient is concerned about some symptoms she is having with her breast- she is having bilateral nipple pain and tenderness..Shelly Watts

## 2016-11-26 ENCOUNTER — Other Ambulatory Visit: Payer: Medicaid Other

## 2016-11-26 DIAGNOSIS — Z3400 Encounter for supervision of normal first pregnancy, unspecified trimester: Secondary | ICD-10-CM

## 2016-11-27 LAB — CBC
Hematocrit: 36.6 % (ref 34.0–46.6)
Hemoglobin: 12.2 g/dL (ref 11.1–15.9)
MCH: 32.4 pg (ref 26.6–33.0)
MCHC: 33.3 g/dL (ref 31.5–35.7)
MCV: 97 fL (ref 79–97)
PLATELETS: 278 10*3/uL (ref 150–379)
RBC: 3.77 x10E6/uL (ref 3.77–5.28)
RDW: 14 % (ref 12.3–15.4)
WBC: 8.4 10*3/uL (ref 3.4–10.8)

## 2016-11-27 LAB — GLUCOSE TOLERANCE, 2 HOURS W/ 1HR
GLUCOSE, 1 HOUR: 98 mg/dL (ref 65–179)
Glucose, 2 hour: 85 mg/dL (ref 65–152)
Glucose, Fasting: 74 mg/dL (ref 65–91)

## 2016-11-27 LAB — HIV ANTIBODY (ROUTINE TESTING W REFLEX): HIV Screen 4th Generation wRfx: NONREACTIVE

## 2016-11-27 LAB — SYPHILIS: RPR W/REFLEX TO RPR TITER AND TREPONEMAL ANTIBODIES, TRADITIONAL SCREENING AND DIAGNOSIS ALGORITHM: RPR Ser Ql: NONREACTIVE

## 2016-12-21 ENCOUNTER — Encounter: Payer: Self-pay | Admitting: Obstetrics

## 2016-12-22 ENCOUNTER — Encounter: Payer: Self-pay | Admitting: Obstetrics

## 2016-12-22 ENCOUNTER — Ambulatory Visit (INDEPENDENT_AMBULATORY_CARE_PROVIDER_SITE_OTHER): Payer: Medicaid Other | Admitting: Obstetrics

## 2016-12-22 VITALS — BP 114/75 | HR 84 | Wt 252.0 lb

## 2016-12-22 DIAGNOSIS — Z3689 Encounter for other specified antenatal screening: Secondary | ICD-10-CM

## 2016-12-22 DIAGNOSIS — B369 Superficial mycosis, unspecified: Secondary | ICD-10-CM

## 2016-12-22 DIAGNOSIS — Z6841 Body Mass Index (BMI) 40.0 and over, adult: Secondary | ICD-10-CM

## 2016-12-22 DIAGNOSIS — Z3403 Encounter for supervision of normal first pregnancy, third trimester: Secondary | ICD-10-CM

## 2016-12-22 DIAGNOSIS — Z3400 Encounter for supervision of normal first pregnancy, unspecified trimester: Secondary | ICD-10-CM

## 2016-12-22 MED ORDER — CLOTRIMAZOLE 1 % EX CREA: 1.0000 "application " | TOPICAL_CREAM | Freq: Two times a day (BID) | CUTANEOUS | 1 refills | Status: DC

## 2016-12-22 NOTE — Progress Notes (Signed)
Subjective:  Shelly Watts is a 21 y.o. G1P0 at [redacted]w[redacted]d being seen today for ongoing prenatal care.  She is currently monitored for the following issues for this low-risk pregnancy and has Supervision of normal first pregnancy, antepartum; Cystic fibrosis carrier, antepartum; and [redacted] weeks gestation of pregnancy on her problem list.  Patient reports rash on chest and arms.  Contractions: Not present. Vag. Bleeding: None.  Movement: Present. Denies leaking of fluid.   The following portions of the patient's history were reviewed and updated as appropriate: allergies, current medications, past family history, past medical history, past social history, past surgical history and problem list. Problem list updated.  Objective:   Vitals:   12/22/16 1335  BP: 114/75  Pulse: 84  Weight: 252 lb (114.3 kg)    Fetal Status:     Movement: Present     General:  Alert, oriented and cooperative. Patient is in no acute distress.  Skin: Skin is warm and dry. No rash noted.   Cardiovascular: Normal heart rate noted  Respiratory: Normal respiratory effort, no problems with respiration noted  Abdomen: Soft, gravid, appropriate for gestational age. Pain/Pressure: Present     Pelvic:  Cervical exam deferred        Extremities: Normal range of motion.  Edema: None  Mental Status: Normal mood and affect. Normal behavior. Normal judgment and thought content.   Urinalysis:      Assessment and Plan:  Pregnancy: G1P0 at [redacted]w[redacted]d  1. Supervision of normal first pregnancy, antepartum  2. Class 3 severe obesity due to excess calories without serious comorbidity with body mass index (BMI) of 45.0 to 49.9 in adult [facility]) Rx: - [place] MFM OB FOLLOW UP; Future  3. Encounter for ultrasound to assess interval growth of fetus Rx: - [place] MFM OB FOLLOW UP; Future  4. Superficial fungus infection of skin Rx: - clotrimazole (LOTRIMIN) 1 % cream; Apply 1 application topically 2 (two) times daily.  Dispense: 113 g; Refill:  1  Preterm labor symptoms and general obstetric precautions including but not limited to vaginal bleeding, contractions, leaking of fluid and fetal movement were reviewed in detail with the patient. Please refer to After Visit Summary for other counseling recommendations.  Return in about 2 weeks (around 01/05/2017) for ROB.  GBS.Marland Kitchen   Brock Bad, MD

## 2016-12-22 NOTE — Progress Notes (Signed)
Patient is having pain in pelvis area and sometimes numbness in legs. Patient has a rash on chest, arms and nack- it has been present since pregnancy- but spreading for 2 weeks. It is itching. Dry, flaky patches [place] will help symptoms- but it is getting worse.

## 2016-12-30 ENCOUNTER — Ambulatory Visit (HOSPITAL_COMMUNITY)
Admission: RE | Admit: 2016-12-30 | Discharge: 2016-12-30 | Disposition: A | Discharge status: Home / Self Care | Payer: Medicaid Other | Source: Ambulatory Visit | Attending: Obstetrics | Admitting: Obstetrics

## 2016-12-30 ENCOUNTER — Encounter (HOSPITAL_COMMUNITY): Payer: Self-pay

## 2016-12-30 ENCOUNTER — Other Ambulatory Visit: Payer: Self-pay | Admitting: Obstetrics

## 2016-12-30 DIAGNOSIS — Z362 Encounter for other antenatal screening follow-up: Secondary | ICD-10-CM | POA: Insufficient documentation

## 2016-12-30 DIAGNOSIS — O99213 Obesity complicating pregnancy, third trimester: Secondary | ICD-10-CM | POA: Insufficient documentation

## 2016-12-30 DIAGNOSIS — Z3A34 34 weeks gestation of pregnancy: Secondary | ICD-10-CM | POA: Insufficient documentation

## 2016-12-30 DIAGNOSIS — Z6841 Body Mass Index (BMI) 40.0 and over, adult: Secondary | ICD-10-CM

## 2016-12-30 DIAGNOSIS — Z3400 Encounter for supervision of normal first pregnancy, unspecified trimester: Secondary | ICD-10-CM

## 2016-12-30 DIAGNOSIS — Z141 Cystic fibrosis carrier: Secondary | ICD-10-CM

## 2016-12-30 DIAGNOSIS — O09899 Supervision of other high risk pregnancies, unspecified trimester: Secondary | ICD-10-CM

## 2016-12-30 DIAGNOSIS — Z3689 Encounter for other specified antenatal screening: Secondary | ICD-10-CM

## 2017-01-05 ENCOUNTER — Encounter: Payer: Self-pay | Admitting: Obstetrics

## 2017-01-05 ENCOUNTER — Ambulatory Visit (INDEPENDENT_AMBULATORY_CARE_PROVIDER_SITE_OTHER): Payer: Medicaid Other | Admitting: Obstetrics

## 2017-01-05 ENCOUNTER — Other Ambulatory Visit (HOSPITAL_COMMUNITY)
Admission: RE | Admit: 2017-01-05 | Discharge: 2017-01-05 | Disposition: A | Discharge status: Home / Self Care | Payer: Medicaid Other | Source: Ambulatory Visit | Attending: Obstetrics | Admitting: Obstetrics

## 2017-01-05 VITALS — BP 126/76 | HR 96 | Wt 256.0 lb

## 2017-01-05 DIAGNOSIS — Z3403 Encounter for supervision of normal first pregnancy, third trimester: Secondary | ICD-10-CM

## 2017-01-05 LAB — OB RESULTS CONSOLE GC/CHLAMYDIA: Gonorrhea: NEGATIVE

## 2017-01-05 NOTE — Progress Notes (Signed)
Subjective:  Shelly Watts is a 21 y.o. G1P0 at [redacted]w[redacted]d being seen today for ongoing prenatal care.  She is currently monitored for the following issues for this low-risk pregnancy and has Supervision of normal first pregnancy, antepartum; Cystic fibrosis carrier, antepartum; and [redacted] weeks gestation of pregnancy on her problem list.  Patient reports no complaints.  Contractions: Not present. Vag. Bleeding: None.  Movement: Present. Denies leaking of fluid.   The following portions of the patient's history were reviewed and updated as appropriate: allergies, current medications, past family history, past medical history, past social history, past surgical history and problem list. Problem list updated.  Objective:   Vitals:   01/05/17 1130  BP: 126/76  Pulse: 96  Weight: 256 lb (116.1 kg)    Fetal Status: Fetal Heart Rate (bpm): 150   Movement: Present     General:  Alert, oriented and cooperative. Patient is in no acute distress.  Skin: Skin is warm and dry. No rash noted.   Cardiovascular: Normal heart rate noted  Respiratory: Normal respiratory effort, no problems with respiration noted  Abdomen: Soft, gravid, appropriate for gestational age. Pain/Pressure: Absent     Pelvic:  Cervical exam deferred        Extremities: Normal range of motion.     Mental Status: Normal mood and affect. Normal behavior. Normal judgment and thought content.   Urinalysis:      Assessment and Plan:  Pregnancy: G1P0 at [redacted]w[redacted]d  1. Encounter for supervision of normal first pregnancy in third trimester Rx: - Strep Gp B NAA - Cervicovaginal ancillary only  Preterm labor symptoms and general obstetric precautions including but not limited to vaginal bleeding, contractions, leaking of fluid and fetal movement were reviewed in detail with the patient. Please refer to After Visit Summary for other counseling recommendations.  Return in about 1 week (around 01/12/2017) for ROB.   Brock Bad, MD

## 2017-01-06 LAB — CERVICOVAGINAL ANCILLARY ONLY
Bacterial vaginitis: NEGATIVE
CANDIDA VAGINITIS: NEGATIVE
Chlamydia: NEGATIVE
Neisseria Gonorrhea: NEGATIVE
Trichomonas: NEGATIVE

## 2017-01-07 LAB — STREP GP B NAA: Strep Gp B NAA: NEGATIVE

## 2017-01-12 ENCOUNTER — Encounter: Payer: Self-pay | Admitting: Obstetrics

## 2017-01-12 ENCOUNTER — Ambulatory Visit (INDEPENDENT_AMBULATORY_CARE_PROVIDER_SITE_OTHER): Payer: Medicaid Other | Admitting: Obstetrics

## 2017-01-12 DIAGNOSIS — Z3400 Encounter for supervision of normal first pregnancy, unspecified trimester: Secondary | ICD-10-CM

## 2017-01-12 DIAGNOSIS — Z3403 Encounter for supervision of normal first pregnancy, third trimester: Secondary | ICD-10-CM

## 2017-01-12 NOTE — Progress Notes (Signed)
Pt states she is doing well at this time.

## 2017-01-12 NOTE — Progress Notes (Signed)
Subjective:  Stefan Karen is a 21 y.o. G1P0 at [redacted]w[redacted]d being seen today for ongoing prenatal care.  She is currently monitored for the following issues for this low-risk pregnancy and has Supervision of normal first pregnancy, antepartum; Cystic fibrosis carrier, antepartum; and [redacted] weeks gestation of pregnancy on her problem list.  Patient reports no complaints.  Contractions: Irritability. Vag. Bleeding: None.  Movement: Present. Denies leaking of fluid.   The following portions of the patient's history were reviewed and updated as appropriate: allergies, current medications, past family history, past medical history, past social history, past surgical history and problem list. Problem list updated.  Objective:   Vitals:   01/12/17 1608  BP: 114/74  Pulse: 91  Weight: 256 lb (116.1 kg)    Fetal Status:     Movement: Present     General:  Alert, oriented and cooperative. Patient is in no acute distress.  Skin: Skin is warm and dry. No rash noted.   Cardiovascular: Normal heart rate noted  Respiratory: Normal respiratory effort, no problems with respiration noted  Abdomen: Soft, gravid, appropriate for gestational age. Pain/Pressure: Absent     Pelvic:  Cervical exam deferred        Extremities: Normal range of motion.  Edema: Mild pitting, slight indentation  Mental Status: Normal mood and affect. Normal behavior. Normal judgment and thought content.   Urinalysis:      Assessment and Plan:  Pregnancy: G1P0 at [redacted]w[redacted]d  1. Supervision of normal first pregnancy, antepartum   Preterm labor symptoms and general obstetric precautions including but not limited to vaginal bleeding, contractions, leaking of fluid and fetal movement were reviewed in detail with the patient. Please refer to After Visit Summary for other counseling recommendations.  Return in about 1 week (around 01/19/2017) for ROB.   Brock Bad, MD

## 2017-01-21 ENCOUNTER — Encounter: Payer: Medicaid Other | Admitting: Obstetrics

## 2017-01-22 ENCOUNTER — Ambulatory Visit (INDEPENDENT_AMBULATORY_CARE_PROVIDER_SITE_OTHER): Payer: Medicaid Other | Admitting: Obstetrics

## 2017-01-22 ENCOUNTER — Encounter: Payer: Self-pay | Admitting: Obstetrics

## 2017-01-22 DIAGNOSIS — Z3400 Encounter for supervision of normal first pregnancy, unspecified trimester: Secondary | ICD-10-CM

## 2017-01-22 DIAGNOSIS — Z3403 Encounter for supervision of normal first pregnancy, third trimester: Secondary | ICD-10-CM

## 2017-01-22 NOTE — Progress Notes (Signed)
Subjective:  Shelly Watts is a 21 y.o. G1P0 at 2171w1d being seen today for ongoing prenatal care.  She is currently monitored for the following issues for this low-risk pregnancy and has Supervision of normal first pregnancy, antepartum; Cystic fibrosis carrier, antepartum; and [redacted] weeks gestation of pregnancy on her problem list.  Patient reports no complaints.  Contractions: Irritability. Vag. Bleeding: None.  Movement: Present. Denies leaking of fluid.   The following portions of the patient's history were reviewed and updated as appropriate: allergies, current medications, past family history, past medical history, past social history, past surgical history and problem list. Problem list updated.  Objective:   Vitals:   01/22/17 0923  BP: 120/84  Pulse: 89  Weight: 262 lb 6.4 oz (119 kg)    Fetal Status: Fetal Heart Rate (bpm): 150   Movement: Present     General:  Alert, oriented and cooperative. Patient is in no acute distress.  Skin: Skin is warm and dry. No rash noted.   Cardiovascular: Normal heart rate noted  Respiratory: Normal respiratory effort, no problems with respiration noted  Abdomen: Soft, gravid, appropriate for gestational age. Pain/Pressure: Absent     Pelvic:  Cervical exam deferred        Extremities: Normal range of motion.  Edema: Trace  Mental Status: Normal mood and affect. Normal behavior. Normal judgment and thought content.   Urinalysis:      Assessment and Plan:  Pregnancy: G1P0 at 7071w1d  1. Supervision of normal first pregnancy, antepartum   Preterm labor symptoms and general obstetric precautions including but not limited to vaginal bleeding, contractions, leaking of fluid and fetal movement were reviewed in detail with the patient. Please refer to After Visit Summary for other counseling recommendations.  Return in about 1 week (around 01/29/2017) for ROB.   Brock BadHarper, Charles A, MD

## 2017-01-22 NOTE — Progress Notes (Signed)
Pt states she is doing well at this time. 

## 2017-01-27 ENCOUNTER — Encounter (HOSPITAL_COMMUNITY): Payer: Self-pay | Admitting: *Deleted

## 2017-01-27 ENCOUNTER — Inpatient Hospital Stay (HOSPITAL_COMMUNITY)
Admission: AD | Admit: 2017-01-27 | Discharge: 2017-01-27 | Disposition: A | Discharge status: Home / Self Care | Payer: Medicaid Other | Source: Ambulatory Visit | Attending: Family Medicine | Admitting: Family Medicine

## 2017-01-27 DIAGNOSIS — Z3A38 38 weeks gestation of pregnancy: Secondary | ICD-10-CM | POA: Diagnosis not present

## 2017-01-27 DIAGNOSIS — O26893 Other specified pregnancy related conditions, third trimester: Secondary | ICD-10-CM | POA: Insufficient documentation

## 2017-01-27 DIAGNOSIS — Z825 Family history of asthma and other chronic lower respiratory diseases: Secondary | ICD-10-CM | POA: Insufficient documentation

## 2017-01-27 DIAGNOSIS — R109 Unspecified abdominal pain: Secondary | ICD-10-CM

## 2017-01-27 NOTE — MAU Provider Note (Signed)
History     CSN: [id number]  Arrival date and time: 01/27/17 1191   First Provider Initiated Contact with Patient 01/27/17 2015      Chief Complaint  Patient presents with  . Abdominal Pain   HPI  [name] is a 21 y.o. G1P0 at [redacted]w[redacted]d gestation presenting to MAU with complaints of abdominal pain to the right of her belly button. She states the pain worsens when she stands up and eases off when she sits down.  She describes the pain as outward pressure.  She denies contractions, VB or LOF.  She reports good (+) FM.  Past Medical History:  Diagnosis Date  . Headache   . Medical history non-contributory     Past Surgical History:  Procedure Laterality Date  . NO PAST SURGERIES      Family History  Problem Relation Age of Onset  . Miscarriages / [place] Mother   . Asthma Brother   . Heart disease Maternal Grandmother   . Hypertension Maternal Grandmother   . Diabetes Maternal Grandfather   . Hypertension Maternal Grandfather     Social History  Substance Use Topics  . Smoking status: Never Smoker  . Smokeless tobacco: Never Used  . Alcohol use No     Comment: none since posiive  pregnancy test    Allergies: No Known Allergies  Prescriptions Prior to Admission  Medication Sig Dispense Refill Last Dose  . Prenatal Vit-Fe Fumarate-FA (PRENATAL VITAMIN) 27-0.8 MG TABS Take 1 tablet by mouth daily. 90 tablet 5 01/26/2017 at Unknown time  . clotrimazole (LOTRIMIN) 1 % cream Apply 1 application topically 2 (two) times daily. 113 g 1 Taking    Review of Systems  Constitutional: Negative.   HENT: Negative.   Eyes: Negative.   Respiratory: Negative.   Cardiovascular: Negative.   Gastrointestinal: Positive for abdominal pain (outward pressure). Negative for diarrhea, nausea and vomiting.  Endocrine: Negative.   Genitourinary: Negative.   Musculoskeletal: Negative.   Skin: Negative.   Allergic/Immunologic: Negative.   Neurological: Negative.    Hematological: Negative.   Psychiatric/Behavioral: Negative.    Physical Exam   Blood pressure 123/79, pulse 83, temperature (!) 97.4 F (36.3 C), temperature source Oral, resp. rate 19, weight 265 lb 12.8 oz (120.6 kg), last menstrual period 05/02/2016.  Physical Exam  Nursing note and vitals reviewed. Constitutional: She is oriented to person, place, and time. She appears well-developed and well-nourished.  HENT:  Head: Normocephalic.  Eyes: Pupils are equal, round, and reactive to light.  Neck: Normal range of motion.  Cardiovascular: Normal rate, regular rhythm and normal heart sounds.   Respiratory: Effort normal and breath sounds normal.  GI: Soft. Bowel sounds are normal.  Musculoskeletal: Normal range of motion.  Neurological: She is alert and oriented to person, place, and time.  Skin: Skin is warm and dry.  Psychiatric: She has a normal mood and affect. Her behavior is normal. Judgment and thought content normal.   Dilation: Closed Effacement (%): Thick Cervical Position: Posterior Station: Ballotable Presentation: Vertex Exam by:: Carloyn Jaeger, CNM  MAU Course  Procedures  MDM NST - FHR: 130 bpm / moderate variability / accels present / decels absent / TOCO: none  Assessment and Plan  Abdominal pain during pregnancy in third trimester - Advised to soak in warm tub of water for about 20 minutes, take Tylenol 1000 mg and Benadryl 25 mg tonight before bed. - Keep scheduled appt on 11/1 with [facility] - Labor precautions given  Discharge home  Patient verbalized an understanding of the plan of care and agrees.    Raelyn Moraolitta Rumeal Cullipher, MSN, CNM 01/27/2017, 8:20 PM

## 2017-01-27 NOTE — MAU Note (Signed)
Pt presents to MAU c/o abdominal pain. Pt states the pain is right around her belly button. Pt states when she is standing she has a constant pain that will peak at times and then will "fall back" pt states when she sits down it kind of mellows out. Pt denies LOF or bleeding. Pt states good fetal movement. Pt denies any other complaints at this time.

## 2017-01-27 NOTE — Discharge Instructions (Signed)
Fetal Movement Counts Patient Name: ________________________________________________ Patient Due Date: ____________________ What is a fetal movement count? A fetal movement count is the number of times that you feel your baby move during a certain amount of time. This may also be called a fetal kick count. A fetal movement count is recommended for every pregnant woman. You may be asked to start counting fetal movements as early as week 28 of your pregnancy. Pay attention to when your baby is most active. You may notice your baby's sleep and wake cycles. You may also notice things that make your baby move more. You should do a fetal movement count:  When your baby is normally most active.  At the same time each day.  A good time to count movements is while you are resting, after having something to eat and drink. How do I count fetal movements? 1. Find a quiet, comfortable area. Sit, or lie down on your side. 2. Write down the date, the start time and stop time, and the number of movements that you felt between those two times. Take this information with you to your health care visits. 3. For 2 hours, count kicks, flutters, swishes, rolls, and jabs. You should feel at least 10 movements during 2 hours. 4. You may stop counting after you have felt 10 movements. 5. If you do not feel 10 movements in 2 hours, have something to eat and drink. Then, keep resting and counting for 1 hour. If you feel at least 4 movements during that hour, you may stop counting. Contact a health care provider if:  You feel fewer than 4 movements in 2 hours.  Your baby is not moving like he or she usually does. Date: ____________ Start time: ____________ Stop time: ____________ Movements: ____________ Date: ____________ Start time: ____________ Stop time: ____________ Movements: ____________ Date: ____________ Start time: ____________ Stop time: ____________ Movements: ____________ Date: ____________ Start time:  ____________ Stop time: ____________ Movements: ____________ Date: ____________ Start time: ____________ Stop time: ____________ Movements: ____________ Date: ____________ Start time: ____________ Stop time: ____________ Movements: ____________ Date: ____________ Start time: ____________ Stop time: ____________ Movements: ____________ Date: ____________ Start time: ____________ Stop time: ____________ Movements: ____________ Date: ____________ Start time: ____________ Stop time: ____________ Movements: ____________ This information is not intended to replace advice given to you by your health care provider. Make sure you discuss any questions you have with your health care provider. Document Released: 04/15/2006 Document Revised: 11/13/2015 Document Reviewed: 04/25/2015 Elsevier Interactive Patient Education  2018 [employer] You can soak in a tub of water water for about 20 minutes, take Tylenol 1000 mg and Benadryl 25 mg tonight before bed.

## 2017-01-28 ENCOUNTER — Ambulatory Visit (INDEPENDENT_AMBULATORY_CARE_PROVIDER_SITE_OTHER): Payer: Medicaid Other | Admitting: Certified Nurse Midwife

## 2017-01-28 ENCOUNTER — Encounter: Payer: Self-pay | Admitting: Certified Nurse Midwife

## 2017-01-28 VITALS — BP 118/87 | HR 111 | Wt 268.5 lb

## 2017-01-28 DIAGNOSIS — Z3400 Encounter for supervision of normal first pregnancy, unspecified trimester: Secondary | ICD-10-CM

## 2017-01-28 NOTE — Progress Notes (Signed)
   PRENATAL VISIT NOTE  Subjective:  Shelly Watts is a 21 y.o. G1P0 at 9217w0d being seen today for ongoing prenatal care.  She is currently monitored for the following issues for this low-risk pregnancy and has Supervision of normal first pregnancy, antepartum; Cystic fibrosis carrier, antepartum; [redacted] weeks gestation of pregnancy; and Abdominal pain during pregnancy in third trimester on her problem list.  Patient reports no complaints.  Contractions: Irritability. Vag. Bleeding: None.  Movement: Present. Denies leaking of fluid.   The following portions of the patient's history were reviewed and updated as appropriate: allergies, current medications, past family history, past medical history, past social history, past surgical history and problem list. Problem list updated.  Objective:   Vitals:   01/28/17 1524  BP: 118/87  Pulse: (!) 111  Weight: 268 lb 8 oz (121.8 kg)    Fetal Status: Fetal Heart Rate (bpm): 142; doppler Fundal Height: 40 cm Movement: Present     General:  Alert, oriented and cooperative. Patient is in no acute distress.  Skin: Skin is warm and dry. No rash noted.   Cardiovascular: Normal heart rate noted  Respiratory: Normal respiratory effort, no problems with respiration noted  Abdomen: Soft, gravid, appropriate for gestational age.  Pain/Pressure: Absent     Pelvic: Cervical exam deferred        Extremities: Normal range of motion.  Edema: Mild pitting, slight indentation  Mental Status:  Normal mood and affect. Normal behavior. Normal judgment and thought content.   Assessment and Plan:  Pregnancy: G1P0 at 3317w0d  1. Supervision of normal first pregnancy, antepartum     Doing well, was seen in MAU 01/27/17: cervix was closed. IOL scheduled for 41 weeks.   Term labor symptoms and general obstetric precautions including but not limited to vaginal bleeding, contractions, leaking of fluid and fetal movement were reviewed in detail with the patient. Please  refer to After Visit Summary for other counseling recommendations.  Return in about 1 week (around 02/04/2017) for ROB, NST.   Roe Coombsachelle A Hero Kulish, CNM

## 2017-02-01 ENCOUNTER — Inpatient Hospital Stay (HOSPITAL_COMMUNITY)
Admission: AD | Admit: 2017-02-01 | Discharge: 2017-02-01 | Disposition: A | Discharge status: Home / Self Care | Payer: Medicaid Other | Source: Ambulatory Visit | Attending: Family Medicine | Admitting: Family Medicine

## 2017-02-01 ENCOUNTER — Encounter (HOSPITAL_COMMUNITY): Payer: Self-pay | Admitting: *Deleted

## 2017-02-01 ENCOUNTER — Telehealth (HOSPITAL_COMMUNITY): Payer: Self-pay | Admitting: *Deleted

## 2017-02-01 DIAGNOSIS — O479 False labor, unspecified: Secondary | ICD-10-CM

## 2017-02-01 NOTE — Telephone Encounter (Signed)
Preadmission screen  

## 2017-02-01 NOTE — MAU Note (Signed)
I have communicated with Artelia LarocheM. Williams CNM and reviewed vital signs:  Vitals:   02/01/17 0702  BP: 111/74  Pulse: 85  Resp: 19  Temp: 98.1 F (36.7 C)    Vaginal exam:  Dilation: 2.5 Effacement (%): 80 Cervical Position: Middle Station: -2 Presentation: Vertex Exam by:: Dorrene GermanJ. Destinee Taber RN,   Also reviewed contraction pattern and that non-stress test is reactive.  It has been documented that patient is contracting every 2-6 minutes with no cervical change over 2 hours not indicating active labor.  Patient denies any other complaints.  Based on this report provider has given order for discharge.  A discharge order and diagnosis entered by a provider.   Labor discharge instructions reviewed with patient.

## 2017-02-01 NOTE — MAU Note (Signed)
Pt presents to MAU c/o ctxs that started yesterday around 1600. Pt states the ctxs worsened around 0300. Pt reports no bleeding or LOF. Pt reports good FM.

## 2017-02-01 NOTE — Discharge Instructions (Signed)

## 2017-02-02 ENCOUNTER — Inpatient Hospital Stay (HOSPITAL_COMMUNITY)
Admission: AD | Admit: 2017-02-02 | Discharge: 2017-02-04 | DRG: 807 | Disposition: A | Discharge status: Home / Self Care | Payer: Medicaid Other | Source: Ambulatory Visit | Attending: Family Medicine | Admitting: Family Medicine

## 2017-02-02 ENCOUNTER — Telehealth: Payer: Self-pay | Admitting: *Deleted

## 2017-02-02 ENCOUNTER — Encounter (HOSPITAL_COMMUNITY): Payer: Self-pay

## 2017-02-02 ENCOUNTER — Inpatient Hospital Stay (HOSPITAL_COMMUNITY)
Admission: AD | Admit: 2017-02-02 | Discharge: 2017-02-02 | Disposition: A | Discharge status: Home / Self Care | Payer: Medicaid Other | Source: Ambulatory Visit | Attending: Obstetrics & Gynecology | Admitting: Obstetrics & Gynecology

## 2017-02-02 DIAGNOSIS — Z3A39 39 weeks gestation of pregnancy: Secondary | ICD-10-CM

## 2017-02-02 DIAGNOSIS — O09899 Supervision of other high risk pregnancies, unspecified trimester: Secondary | ICD-10-CM

## 2017-02-02 DIAGNOSIS — O26893 Other specified pregnancy related conditions, third trimester: Secondary | ICD-10-CM

## 2017-02-02 DIAGNOSIS — Z880 Allergy status to penicillin: Secondary | ICD-10-CM

## 2017-02-02 DIAGNOSIS — Z3400 Encounter for supervision of normal first pregnancy, unspecified trimester: Secondary | ICD-10-CM

## 2017-02-02 DIAGNOSIS — Z141 Cystic fibrosis carrier: Secondary | ICD-10-CM

## 2017-02-02 DIAGNOSIS — R109 Unspecified abdominal pain: Secondary | ICD-10-CM

## 2017-02-02 DIAGNOSIS — O479 False labor, unspecified: Secondary | ICD-10-CM

## 2017-02-02 MED ORDER — OXYTOCIN 10 UNIT/ML IJ SOLN
INTRAMUSCULAR | Status: AC
  Administered 2017-02-02: 10 [IU]
  Filled 2017-02-02: qty 1

## 2017-02-02 MED ORDER — OXYCODONE-ACETAMINOPHEN 5-325 MG PO TABS
2.0000 | ORAL_TABLET | Freq: Once | ORAL | Status: AC
  Administered 2017-02-02: 2 via ORAL
  Filled 2017-02-02: qty 2

## 2017-02-02 MED ORDER — LIDOCAINE HCL (PF) 1 % IJ SOLN
INTRAMUSCULAR | Status: AC
  Administered 2017-02-02: 30 mL
  Filled 2017-02-02: qty 30

## 2017-02-02 NOTE — MAU Note (Signed)
Pt presents with complaint of worsening contractions, seen yesterday and SVE 2-3

## 2017-02-02 NOTE — Telephone Encounter (Signed)
Pt called to office stating [facility]. Pt states she had no change in cervix and was sent home. Pt states she is now having a lot of pain and would like to know what she should do.   Attempt to contact pt. No answer, VM not set up.

## 2017-02-02 NOTE — MAU Note (Signed)
Transferred to L&D on stretcher with RNs and Dr. Nira Retortegele.

## 2017-02-02 NOTE — MAU Note (Signed)
Reports ROM about 2300. Feel the urge to push. Has meconium fluid.

## 2017-02-02 NOTE — MAU Note (Signed)
I have communicated with Dr. Rachelle HoraMoss and reviewed vital signs:  Vitals:   02/02/17 1056  BP: 122/72  Pulse: 84  Resp: 20  Temp: 97.9 F (36.6 C)  SpO2: 100%    Vaginal exam:  Dilation: 4 Effacement (%): 80, 90 Cervical Position: Middle Station: -2 Presentation: Vertex Exam by:: Ginnie Smartachel Brylan Dec RN,   Also reviewed contraction pattern and that non-stress test is reactive.  It has been documented that patient is contracting every 7-12 minutes with minimal cervical change over 1 hour not indicating active labor.  Patient denies any other complaints.  Based on this report provider has given order for discharge.  A discharge order and diagnosis entered by a provider.   Labor discharge instructions reviewed with patient.

## 2017-02-02 NOTE — MAU Note (Signed)
Pt here with c/o contractions for 3 days and

## 2017-02-03 ENCOUNTER — Other Ambulatory Visit: Payer: Self-pay

## 2017-02-03 ENCOUNTER — Encounter (HOSPITAL_COMMUNITY): Payer: Self-pay

## 2017-02-03 DIAGNOSIS — Z3A39 39 weeks gestation of pregnancy: Secondary | ICD-10-CM

## 2017-02-03 LAB — CBC
HCT: 36.7 % (ref 36.0–46.0)
Hemoglobin: 13.3 g/dL (ref 12.0–15.0)
MCH: 33.6 pg (ref 26.0–34.0)
MCHC: 36.2 g/dL — ABNORMAL HIGH (ref 30.0–36.0)
MCV: 92.7 fL (ref 78.0–100.0)
PLATELETS: 253 10*3/uL (ref 150–400)
RBC: 3.96 MIL/uL (ref 3.87–5.11)
RDW: 14.1 % (ref 11.5–15.5)
WBC: 14.5 10*3/uL — AB (ref 4.0–10.5)

## 2017-02-03 LAB — TYPE AND SCREEN
ABO/RH(D): O POS
ANTIBODY SCREEN: NEGATIVE

## 2017-02-03 LAB — RPR: RPR: NONREACTIVE

## 2017-02-03 LAB — ABO/RH: ABO/RH(D): O POS

## 2017-02-03 MED ORDER — LACTATED RINGERS IV SOLN: INTRAVENOUS | Status: DC

## 2017-02-03 MED ORDER — WITCH HAZEL-GLYCERIN EX PADS: 1.0000 "application " | MEDICATED_PAD | CUTANEOUS | Status: DC | PRN

## 2017-02-03 MED ORDER — ZOLPIDEM TARTRATE 5 MG PO TABS: 5.0000 mg | ORAL_TABLET | Freq: Every evening | ORAL | Status: DC | PRN

## 2017-02-03 MED ORDER — ONDANSETRON HCL 4 MG/2ML IJ SOLN: 4.0000 mg | Freq: Four times a day (QID) | INTRAMUSCULAR | Status: DC | PRN

## 2017-02-03 MED ORDER — OXYCODONE-ACETAMINOPHEN 5-325 MG PO TABS: 1.0000 | ORAL_TABLET | ORAL | Status: DC | PRN

## 2017-02-03 MED ORDER — ONDANSETRON HCL 4 MG/2ML IJ SOLN: 4.0000 mg | INTRAMUSCULAR | Status: DC | PRN

## 2017-02-03 MED ORDER — DIPHENHYDRAMINE HCL 25 MG PO CAPS: 25.0000 mg | ORAL_CAPSULE | Freq: Four times a day (QID) | ORAL | Status: DC | PRN

## 2017-02-03 MED ORDER — OXYTOCIN 40 UNITS IN LACTATED RINGERS INFUSION - SIMPLE MED: 2.5000 [IU]/h | INTRAVENOUS | Status: DC

## 2017-02-03 MED ORDER — SOD CITRATE-CITRIC ACID 500-334 MG/5ML PO SOLN: 30.0000 mL | ORAL | Status: DC | PRN

## 2017-02-03 MED ORDER — BENZOCAINE-MENTHOL 20-0.5 % EX AERO
1.0000 "application " | INHALATION_SPRAY | CUTANEOUS | Status: DC | PRN
  Administered 2017-02-03: 1 via TOPICAL
  Filled 2017-02-03: qty 56

## 2017-02-03 MED ORDER — IBUPROFEN 600 MG PO TABS
600.0000 mg | ORAL_TABLET | Freq: Four times a day (QID) | ORAL | Status: DC
  Administered 2017-02-03 – 2017-02-04 (×6): 600 mg via ORAL
  Filled 2017-02-03 (×4): qty 1

## 2017-02-03 MED ORDER — DIBUCAINE 1 % RE OINT: 1.0000 "application " | TOPICAL_OINTMENT | RECTAL | Status: DC | PRN

## 2017-02-03 MED ORDER — LIDOCAINE HCL (PF) 1 % IJ SOLN
30.0000 mL | INTRAMUSCULAR | Status: DC | PRN
  Filled 2017-02-03: qty 30

## 2017-02-03 MED ORDER — ACETAMINOPHEN 325 MG PO TABS: 650.0000 mg | ORAL_TABLET | ORAL | Status: DC | PRN

## 2017-02-03 MED ORDER — SENNOSIDES-DOCUSATE SODIUM 8.6-50 MG PO TABS
2.0000 | ORAL_TABLET | ORAL | Status: DC
  Administered 2017-02-03: 2 via ORAL

## 2017-02-03 MED ORDER — PRENATAL MULTIVITAMIN CH
1.0000 | ORAL_TABLET | Freq: Every day | ORAL | Status: DC
  Administered 2017-02-03 – 2017-02-04 (×2): 1 via ORAL
  Filled 2017-02-03 (×2): qty 1

## 2017-02-03 MED ORDER — LACTATED RINGERS IV SOLN: 500.0000 mL | INTRAVENOUS | Status: DC | PRN

## 2017-02-03 MED ORDER — OXYTOCIN BOLUS FROM INFUSION: 500.0000 mL | Freq: Once | INTRAVENOUS | Status: DC

## 2017-02-03 MED ORDER — COCONUT OIL OIL: 1.0000 "application " | TOPICAL_OIL | Status: DC | PRN

## 2017-02-03 MED ORDER — ONDANSETRON HCL 4 MG PO TABS: 4.0000 mg | ORAL_TABLET | ORAL | Status: DC | PRN

## 2017-02-03 MED ORDER — SIMETHICONE 80 MG PO CHEW: 80.0000 mg | CHEWABLE_TABLET | ORAL | Status: DC | PRN

## 2017-02-03 MED ORDER — TETANUS-DIPHTH-ACELL PERTUSSIS 5-2.5-18.5 LF-MCG/0.5 IM SUSP: 0.5000 mL | Freq: Once | INTRAMUSCULAR | Status: DC

## 2017-02-03 MED ORDER — OXYCODONE-ACETAMINOPHEN 5-325 MG PO TABS: 2.0000 | ORAL_TABLET | ORAL | Status: DC | PRN

## 2017-02-03 NOTE — Lactation Note (Signed)
This note was copied from a baby's chart. Lactation Consultation Note New mom BF well. Mom states baby has BF well several times. Assisted in football position, STS, baby latched well. Taught "C" hold to pendulous breast w/compressible small everted nipples at bottom end of breast. Hand expression taught w/easily expressed colostrum, collected 2ml. Reviewed spoon feeding.  Newborn behavior, feeding, habits, I&O, cluster feeding, supply and demand, pumping, supplementing after BF w/colostrum d/t baby under 6 lbs. Mom encouraged to feed baby 8-12 times/24 hours and with feeding cues.  Mom stated she didn't notice a difference in increase of size during pregnancy in her breast, but nipples were tender and she was leaking colostrum.  LC will ask RN to set up DEBP. Discussed w/mom pumping, hand expressing afterwards, giving colostrum to baby as supplement after BF. WH/LC brochure given w/resources, support groups and LC services.  Patient Name: Girl Kathlen Modyriel [place] Today's Date: 02/03/2017 Reason for consult: Initial assessment   Maternal Data Has patient been taught Hand Expression?: Yes Does the patient have breastfeeding experience prior to this delivery?: No  Feeding Feeding Type: Breast Fed Length of feed: 15 min  LATCH Score Latch: Grasps breast easily, tongue down, lips flanged, rhythmical sucking.  Audible Swallowing: Spontaneous and intermittent  Type of Nipple: Everted at rest and after stimulation  Comfort (Breast/Nipple): Soft / non-tender  Hold (Positioning): Assistance needed to correctly position infant at breast and maintain latch.  LATCH Score: 9  Interventions Interventions: Breast feeding basics reviewed;Assisted with latch;Breast compression;Adjust position;Skin to skin;Breast massage;Support pillows;Hand express;Position options;Expressed milk  Lactation Tools Discussed/Used WIC Program: Yes   Consult Status Consult Status: Follow-up Date:  02/03/17 Follow-up type: In-patient    Charyl DancerCARVER, Chinyere Galiano G 02/03/2017, 3:23 AM

## 2017-02-03 NOTE — H&P (Signed)
OBSTETRIC ADMISSION HISTORY AND PHYSICAL  Shelly Watts is a 21 y.o. female G1P0 with IUP at 7935w6d by US (10/12/16)  presenting for SROM and SOL in advanced labor. She reports feeling contractions for 3 days with visits to MAU during that time. She came earlier today for labor evaluation was 4cm with no change and sent home. She states that she SROM @ 1 hr prior to admission thick greenish fluid. She reports +FMs, no VB, no blurry vision, headaches or peripheral edema, and RUQ pain.  She plans on breast feeding. She request nexplanon for birth control. [facility]   Dating: By US (10/12/16) --->  Estimated Date of Delivery: 02/04/17  Sono:12/30/16    @[redacted]w[redacted]d , CWD, normal anatomy, Cephalic presentation, 2282g, 13%40% EFW   Prenatal History/Complications: -Cystic Fibrosis carrier -Abdominal pain during pregnancy in third trimester  Past Medical History: Past Medical History:  Diagnosis Date  . Headache     Past Surgical History: Past Surgical History:  Procedure Laterality Date  . NO PAST SURGERIES      Obstetrical History: OB History    Gravida Para Term Preterm AB Living   1 0           SAB TAB Ectopic Multiple Live Births                  Social History: Social History   Socioeconomic History  . Marital status: Single    Spouse name: Not on file  . Number of children: Not on file  . Years of education: Not on file  . Highest education level: Not on file  Social Needs  . Financial resource strain: Not on file  . Food insecurity - worry: Not on file  . Food insecurity - inability: Not on file  . Transportation needs - medical: Not on file  . Transportation needs - non-medical: Not on file  Occupational History  . Not on file  Tobacco Use  . Smoking status: Never Smoker  . Smokeless tobacco: Never Used  Substance and Sexual Activity  . Alcohol use: No    Comment: none since posiive  pregnancy test  . Drug use: No  . Sexual activity:  Yes    Comment: currently pregnant  Other Topics Concern  . Not on file  Social History Narrative  . Not on file    Family History: Family History  Problem Relation Age of Onset  . Miscarriages / [place] Mother   . Asthma Brother   . Heart disease Maternal Grandmother   . Hypertension Maternal Grandmother   . Diabetes Maternal Grandfather   . Hypertension Maternal Grandfather     Allergies: No Known Allergies  Medications Prior to Admission  Medication Sig Dispense Refill Last Dose  . clotrimazole (LOTRIMIN) 1 % cream Apply 1 application topically 2 (two) times daily. 113 g 1 Past Week at Unknown time  . Prenatal Vit-Fe Fumarate-FA (PRENATAL VITAMIN) 27-0.8 MG TABS Take 1 tablet by mouth daily. 90 tablet 5 02/01/2017 at Unknown time     Review of Systems   All systems reviewed and negative except as stated in HPI  Blood pressure (!) 101/53, pulse (!) 122, last menstrual period 05/02/2016. General appearance: alert, cooperative and appears stated age Lungs: clear to auscultation bilaterally Heart: regular rate and rhythm Abdomen: soft, non-tender; bowel sounds normal Extremities: Homans sign is negative, no sign of DVT Presentation: cephalic Fetal monitoring  Unable to get fetal strip as patient was in active labor  and complete Uterine activityIntensity: strong and close together Dilation: 10 Effacement (%): 100 Exam by:: D Callaway RN   Prenatal labs: ABO, Rh: O/Positive/-- (07/06 1103) Antibody: Negative (07/06 1103) Rubella: 2.35 (07/06 1103) RPR: Non Reactive (08/30 1050)  HBsAg: Negative (07/06 1103)  HIV:   non regactive GBS: Negative (10/09 1149)  1 hr Glucola fasting 74/ 1hr 98/ 2hr 85 Genetic screening  none Anatomy US normal  Clinic   [facility] Prenatal Labs  Dating  LMP 05/02/16 Blood type: O/Positive/-- (07/06 1103)   Genetic Screen 1 Screen:    AFP:     Quad:     NIPS: Antibody:Negative (07/06 1103)  Anatomic US  Rubella: 2.35 (07/06 1103)   GTT Early:               Third trimester:  RPR: Non Reactive (08/30 1050)   Flu vaccine   declined HBsAg: Negative (07/06 1103)   TDaP vaccine    declined                                 Rhogam: HIV:   Non Reactive (08/30 1050)  Baby Food    Breast Feed                                           ZOX:[id number]:Negative (10/09 1149)(For PCN allergy, check sensitivities)  Contraception    Undecided Pap: 10-02-16  Circumcision   Yes if boy   Pediatrician   undecided   Support Person   Mother-Myeisha & Cousin   Prenatal Classes   Yes    Prenatal Transfer Tool  Maternal Diabetes: No Genetic Screening: none Maternal Ultrasounds/Referrals: Normal Fetal Ultrasounds or other Referrals:  None Maternal Substance Abuse:  No Significant Maternal Medications:  None Significant Maternal Lab Results: Lab values include: Group B Strep negative  No results found for this or any previous visit (from the past 24 hour(s)).  Patient Active Problem List   Diagnosis Date Noted  . Indication for care in labor or delivery 02/03/2017  . Abdominal pain during pregnancy in third trimester 01/27/2017  . 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 [redacted] weeks gestation of pregnancy   . Supervision of normal first pregnancy, antepartum 10/02/2016    Assessment/Plan:  Shelly Watts is a 21 y.o. G1P0 at 2666w6d here for SROM and advanced labor.  #Labor: Advanced labor. Admit to birth suites. Expectant management #Pain: per patient request #FWB:  unable to get fetal strip patient was complete and pushing #ID:  GBS neg #MOF: breast #MOC: nexplanon #Circ:  None (female)  Suella BroadKeriann S Minott, MD  02/03/2017, 12:21 AM   I spoke with and examined patient and agree with resident/PA/SNM's note and plan of care.  Cheral MarkerKimberly R. Booker, CNM, WHNP-BC 02/03/2017 1:33 AM

## 2017-02-03 NOTE — Lactation Note (Signed)
This note was copied from a baby's chart. Lactation Consultation Note  Patient Name: Shelly Watts Today's Date: 02/03/2017 Reason for consult: Follow-up assessment Baby at 21 hr of life. Mom was requesting lactation because she had "bf questions". Mom was concerned about how often to feed baby and how much she should be pumping. After talking with her RN she felt more comfortable. At this visit mom was "just making sure I got it". Baby was sleeping and there were multiple visitors present at this visit, mom declined latch help. She did state she would call for lactation at the next feeding.    Maternal Data    Feeding    LATCH Score                   Interventions    Lactation Tools Discussed/Used     Consult Status Consult Status: Follow-up Date: 02/04/17 Follow-up type: In-patient    Rulon Eisenmengerlizabeth E Trejon Duford 02/03/2017, 9:17 PM

## 2017-02-03 NOTE — Progress Notes (Signed)
POSTPARTUM PROGRESS NOTE  Post Partum Day 1  Subjective:  Shelly Watts is a 21 y.o. G1P1001 s/p SVD at 6864w5d.  No acute events overnight.  Pt denies problems with ambulating, voiding or po intake.  She denies nausea or vomiting.  Pain is moderately controlled.  She has had flatus. She has not had bowel movement.  Lochia Small.   Objective: Blood pressure 107/63, pulse (!) 102, temperature 98.3 F (36.8 C), temperature source Oral, resp. rate 18, height 5' (1.524 m), weight 258 lb (117 kg), last menstrual period 05/02/2016, unknown if currently breastfeeding.  Physical Exam:  General: alert, cooperative and no distress Chest: no respiratory distress Heart:regular rate, distal pulses intact Abdomen: soft, nontender,  Uterine Fundus: firm, appropriately tender DVT Evaluation: No calf swelling or tenderness Extremities: minimal edema Skin: warm, dry   Recent Labs    02/03/17 0247  HGB 13.3  HCT 36.7    Assessment/Plan: Shelly Watts is a 21 y.o. G1P1001 s/p SVD at 5664w5d   PPD#1 - Doing well Contraception: nexplanon Feeding: breast Dispo: Plan for discharge tomorrow.   LOS: 1 day   Lynnae PrudeKeriann S MinottMD 02/03/2017, 7:27 AM

## 2017-02-04 ENCOUNTER — Encounter: Payer: Medicaid Other | Admitting: Obstetrics

## 2017-02-04 MED ORDER — IBUPROFEN 600 MG PO TABS: 600.0000 mg | ORAL_TABLET | Freq: Four times a day (QID) | ORAL | 0 refills | Status: AC

## 2017-02-04 MED ORDER — SENNOSIDES-DOCUSATE SODIUM 8.6-50 MG PO TABS: 2.0000 | ORAL_TABLET | ORAL | 0 refills | Status: DC

## 2017-02-04 NOTE — Lactation Note (Signed)
This note was copied from a baby's chart. Lactation Consultation Note; Mom and baby sleeping. Mom reports baby has been latching well with no pain. [facility]. Has pump pieces- I showed her how to use pieces as manual pump. Reviewed engorgement prevention and treatment. Reviewed our phone number, OP appointments and BFSG as resources for support after DC No questions at present. To call prn  Patient Name: Girl Kathlen Modyriel [place] Today's Date: 02/04/2017 Reason for consult: Follow-up assessment   Maternal Data Formula Feeding for Exclusion: No Has patient been taught Hand Expression?: Yes Does the patient have breastfeeding experience prior to this delivery?: No  Feeding    LATCH Score                   Interventions    Lactation Tools Discussed/Used WIC Program: Yes   Consult Status Consult Status: Complete    Pamelia HoitWeeks, Nishant Schrecengost D 02/04/2017, 8:45 AM

## 2017-02-04 NOTE — Lactation Note (Signed)
This note was copied from a baby's chart. Lactation Consultation Note  Patient Name: Shelly Watts Today's Date: 02/04/2017 Reason for consult: Follow-up assessment   Baby 37 hours old and baby has been sleepy. Mother recently breastfed for 10 min.  Explained that feedings need to be longer and ideally on both breasts. Mother stated baby was much more vigorous at breast during the night.  Provided education regarding cluster feeding. Mother hand expressed drops and attempted to latch in cradle hold with no sustained latch. Repositioned mother and baby to football hold. Baby was able to latch.  A few swallows observed. Recommend mother consider staying tonight to work on feedings since baby is so sleepy. Provided education. Mom encouraged to feed baby 8-12 times/24 hours and with feeding cues.     Maternal Data    Feeding Feeding Type: Breast Fed Length of feed: 10 min  LATCH Score Latch: Repeated attempts needed to sustain latch, nipple held in mouth throughout feeding, stimulation needed to elicit sucking reflex.  Audible Swallowing: A few with stimulation  Type of Nipple: Everted at rest and after stimulation  Comfort (Breast/Nipple): Soft / non-tender  Hold (Positioning): Assistance needed to correctly position infant at breast and maintain latch.  LATCH Score: 7  Interventions Interventions: Support pillows;Expressed milk  Lactation Tools Discussed/Used     Consult Status Consult Status: Follow-up Date: 02/05/17 Follow-up type: In-patient    Dahlia ByesBerkelhammer, Anayia Eugene [facility] 02/04/2017, 12:53 PM

## 2017-02-04 NOTE — Discharge Instructions (Signed)
Breastfeeding °Deciding to breastfeed is one of the best choices you can make for you and your baby. A change in hormones during pregnancy causes your breast tissue to grow and increases the number and size of your milk ducts. These hormones also allow proteins, sugars, and fats from your blood supply to make breast milk in your milk-producing glands. Hormones prevent breast milk from being released before your baby is born as well as prompt milk flow after birth. Once breastfeeding has begun, thoughts of your baby, as well as his or her sucking or crying, can stimulate the release of milk from your milk-producing glands. °Benefits of breastfeeding °For Your Baby °· Your first milk (colostrum) helps your baby's digestive system function better. °· There are antibodies in your milk that help your baby fight off infections. °· Your baby has a lower incidence of asthma, allergies, and sudden infant death syndrome. °· The nutrients in breast milk are better for your baby than infant formulas and are designed uniquely for your baby’s needs. °· Breast milk improves your baby's brain development. °· Your baby is less likely to develop other conditions, such as childhood obesity, asthma, or type 2 diabetes mellitus. ° °For You °· Breastfeeding helps to create a very special bond between you and your baby. °· Breastfeeding is convenient. Breast milk is always available at the correct temperature and costs nothing. °· Breastfeeding helps to burn calories and helps you lose the weight gained during pregnancy. °· Breastfeeding makes your uterus contract to its prepregnancy size faster and slows bleeding (lochia) after you give birth. °· Breastfeeding helps to lower your risk of developing type 2 diabetes mellitus, osteoporosis, and breast or ovarian cancer later in life. ° °Signs that your baby is hungry °Early Signs of Hunger °· Increased alertness or activity. °· Stretching. °· Movement of the head from side to  side. °· Movement of the head and opening of the mouth when the corner of the mouth or cheek is stroked (rooting). °· Increased sucking sounds, smacking lips, cooing, sighing, or squeaking. °· Hand-to-mouth movements. °· Increased sucking of fingers or hands. ° °Late Signs of Hunger °· Fussing. °· Intermittent crying. ° °Extreme Signs of Hunger °Signs of extreme hunger will require calming and consoling before your baby will be able to breastfeed successfully. Do not wait for the following signs of extreme hunger to occur before you initiate breastfeeding: °· Restlessness. °· A loud, strong cry. °· Screaming. ° °Breastfeeding basics °Breastfeeding Initiation °· Find a comfortable place to sit or lie down, with your neck and back well supported. °· Place a pillow or rolled up blanket under your baby to bring him or her to the level of your breast (if you are seated). Nursing pillows are specially designed to help support your arms and your baby while you breastfeed. °· Make sure that your baby's abdomen is facing your abdomen. °· Gently massage your breast. With your fingertips, massage from your chest wall toward your nipple in a circular motion. This encourages milk flow. You may need to continue this action during the feeding if your milk flows slowly. °· Support your breast with 4 fingers underneath and your thumb above your nipple. Make sure your fingers are well away from your nipple and your baby’s mouth. °· Stroke your baby's lips gently with your finger or nipple. °· When your baby's mouth is open wide enough, quickly bring your baby to your breast, placing your entire nipple and as much of the colored area   around your nipple (areola) as possible into your baby's mouth. ? More areola should be visible above your baby's upper lip than below the lower lip. ? Your baby's tongue should be between his or her lower gum and your breast.  Ensure that your baby's mouth is correctly positioned around your nipple  (latched). Your baby's lips should create a seal on your breast and be turned out (everted).  It is common for your baby to suck about 2-3 minutes in order to start the flow of breast milk.  Latching Teaching your baby how to latch on to your breast properly is very important. An improper latch can cause nipple pain and decreased milk supply for you and poor weight gain in your baby. Also, if your baby is not latched onto your nipple properly, he or she may swallow some air during feeding. This can make your baby fussy. Burping your baby when you switch breasts during the feeding can help to get rid of the air. However, teaching your baby to latch on properly is still the best way to prevent fussiness from swallowing air while breastfeeding. Signs that your baby has successfully latched on to your nipple:  Silent tugging or silent sucking, without causing you pain.  Swallowing heard between every 3-4 sucks.  Muscle movement above and in front of his or her ears while sucking.  Signs that your baby has not successfully latched on to nipple:  Sucking sounds or smacking sounds from your baby while breastfeeding.  Nipple pain.  If you think your baby has not latched on correctly, slip your finger into the corner of your babys mouth to break the suction and place it between your baby's gums. Attempt breastfeeding initiation again. Signs of Successful Breastfeeding Signs from your baby:  A gradual decrease in the number of sucks or complete cessation of sucking.  Falling asleep.  Relaxation of his or her body.  Retention of a small amount of milk in his or her mouth.  Letting go of your breast by himself or herself.  Signs from you:  Breasts that have increased in firmness, weight, and size 1-3 hours after feeding.  Breasts that are softer immediately after breastfeeding.  Increased milk volume, as well as a change in milk consistency and color by the fifth day of  breastfeeding.  Nipples that are not sore, cracked, or bleeding.  Signs That Your [employer] is Getting Enough Milk  Wetting at least 1-2 diapers during the first 24 hours after birth.  Wetting at least 5-6 diapers every 24 hours for the first week after birth. The urine should be clear or pale yellow by 5 days after birth.  Wetting 6-8 diapers every 24 hours as your baby continues to grow and develop.  At least 3 stools in a 24-hour period by age 925 days. The stool should be soft and yellow.  At least 3 stools in a 24-hour period by age 92 days. The stool should be seedy and yellow.  No loss of weight greater than 10% of birth weight during the first 19 days of age.  Average weight gain of 4-7 ounces (113-198 g) per week after age 67 days.  Consistent daily weight gain by age 677 days, without weight loss after the age of 2 weeks.  After a feeding, your baby may spit up a small amount. This is common. Breastfeeding frequency and duration Frequent feeding will help you make more milk and can prevent sore nipples and breast engorgement. Breastfeed when  you feel the need to reduce the fullness of your breasts or when your baby shows signs of hunger. This is called "breastfeeding on demand." Avoid introducing a pacifier to your baby while you are working to establish breastfeeding (the first 4-6 weeks after your baby is born). After this time you may choose to use a pacifier. Research has shown that pacifier use during the first year of a baby's life decreases the risk of sudden infant death syndrome (SIDS). °Allow your baby to feed on each breast as long as he or she wants. Breastfeed until your baby is finished feeding. When your baby unlatches or falls asleep while feeding from the first breast, offer the second breast. Because newborns are often sleepy in the first few weeks of life, you may need to awaken your baby to get him or her to feed. °Breastfeeding times will vary from baby to baby. However,  the following rules can serve as a guide to help you ensure that your baby is properly fed: °· Newborns (babies 4 weeks of age or younger) may breastfeed every 1-3 hours. °· Newborns should not go longer than 3 hours during the day or 5 hours during the night without breastfeeding. °· You should breastfeed your baby a minimum of 8 times in a 24-hour period until you begin to introduce solid foods to your baby at around 6 months of age. ° °Breast milk pumping °Pumping and storing breast milk allows you to ensure that your baby is exclusively fed your breast milk, even at times when you are unable to breastfeed. This is especially important if you are going back to work while you are still breastfeeding or when you are not able to be present during feedings. Your lactation consultant can give you guidelines on how long it is safe to store breast milk. °A breast pump is a machine that allows you to pump milk from your breast into a sterile bottle. The pumped breast milk can then be stored in a refrigerator or freezer. Some breast pumps are operated by hand, while others use electricity. Ask your lactation consultant which type will work best for you. Breast pumps can be purchased, but some hospitals and breastfeeding support groups lease breast pumps on a monthly basis. A lactation consultant can teach you how to hand express breast milk, if you prefer not to use a pump. °Caring for your breasts while you breastfeed °Nipples can become dry, cracked, and sore while breastfeeding. The following recommendations can help keep your breasts moisturized and healthy: °· Avoid using soap on your nipples. °· Wear a supportive bra. Although not required, special nursing bras and tank tops are designed to allow access to your breasts for breastfeeding without taking off your entire bra or top. Avoid wearing underwire-style bras or extremely tight bras. °· Air dry your nipples for 3-4 minutes after each feeding. °· Use only cotton  bra pads to absorb leaked breast milk. Leaking of breast milk between feedings is normal. °· Use lanolin on your nipples after breastfeeding. Lanolin helps to maintain your skin's normal moisture barrier. If you use pure lanolin, you do not need to wash it off before feeding your baby again. Pure lanolin is not toxic to your baby. You may also hand express a few drops of breast milk and gently massage that milk into your nipples and allow the milk to air dry. ° °In the first few weeks after giving birth, some women experience extremely full breasts (engorgement). Engorgement can make your   breasts feel heavy, warm, and tender to the touch. Engorgement peaks within 3-5 days after you give birth. The following recommendations can help ease engorgement:  Completely empty your breasts while breastfeeding or pumping. You may want to start by applying warm, moist heat (in the shower or with warm water-soaked hand towels) just before feeding or pumping. This increases circulation and helps the milk flow. If your baby does not completely empty your breasts while breastfeeding, pump any extra milk after he or she is finished.  Wear a snug bra (nursing or regular) or tank top for 1-2 days to signal your body to slightly decrease milk production.  Apply ice packs to your breasts, unless this is too uncomfortable for you.  Make sure that your baby is latched on and positioned properly while breastfeeding.  If engorgement persists after 48 hours of following these recommendations, contact your health care provider or a Advertising copywriterlactation consultant. Overall health care recommendations while breastfeeding  Eat healthy foods. Alternate between meals and snacks, eating 3 of each per day. Because what you eat affects your breast milk, some of the foods may make your baby more irritable than usual. Avoid eating these foods if you are sure that they are negatively affecting your baby.  Drink milk, fruit juice, and water to  satisfy your thirst (about 10 glasses a day).  Rest often, relax, and continue to take your prenatal vitamins to prevent fatigue, stress, and anemia.  Continue breast self-awareness checks.  Avoid chewing and smoking tobacco. Chemicals from cigarettes that pass into breast milk and exposure to secondhand smoke may harm your baby.  Avoid alcohol and drug use, including marijuana. Some medicines that may be harmful to your baby can pass through breast milk. It is important to ask your health care provider before taking any medicine, including all over-the-counter and prescription medicine as well as vitamin and herbal supplements. It is possible to become pregnant while breastfeeding. If birth control is desired, ask your health care provider about options that will be safe for your baby. Contact a health care provider if:  You feel like you want to stop breastfeeding or have become frustrated with breastfeeding.  You have painful breasts or nipples.  Your nipples are cracked or bleeding.  Your breasts are red, tender, or warm.  You have a swollen area on either breast.  You have a fever or chills.  You have nausea or vomiting.  You have drainage other than breast milk from your nipples.  Your breasts do not become full before feedings by the fifth day after you give birth.  You feel sad and depressed.  Your baby is too sleepy to eat well.  Your baby is having trouble sleeping.  Your baby is wetting less than 3 diapers in a 24-hour period.  Your baby has less than 3 stools in a 24-hour period.  Your baby's skin or the white part of his or her eyes becomes yellow.  Your baby is not gaining weight by 695 days of age. Get help right away if:  Your baby is overly tired (lethargic) and does not want to wake up and feed.  Your baby develops an unexplained fever. This information is not intended to replace advice given to you by your health care provider. Make sure you discuss  any questions you have with your health care provider. Document Released: 03/16/2005 Document Revised: 08/28/2015 Document Reviewed: 09/07/2012 Elsevier Interactive Patient Education  2017 [employer] Postpartum Care After Vaginal Delivery The period  of time right after you deliver your newborn is called the postpartum period. What kind of medical care will I receive?  You may continue to receive fluids and medicines through an IV tube inserted into one of your veins.  If an incision was made near your vagina (episiotomy) or if you had some vaginal tearing during delivery, cold compresses may be placed on your episiotomy or your tear. This helps to reduce pain and swelling.  You may be given a squirt bottle to use when you go to the bathroom. You may use this until you are comfortable wiping as usual. To use the squirt bottle, follow these steps: ? Before you urinate, fill the squirt bottle with warm water. Do not use hot water. ? After you urinate, while you are sitting on the toilet, use the squirt bottle to rinse the area around your urethra and vaginal opening. This rinses away any urine and blood. ? You may do this instead of wiping. As you start healing, you may use the squirt bottle before wiping yourself. Make sure to wipe gently. ? Fill the squirt bottle with clean water every time you use the bathroom.  You will be given sanitary pads to wear. How can I expect to feel?  You may not feel the need to urinate for several hours after delivery.  You will have some soreness and pain in your abdomen and vagina.  If you are breastfeeding, you may have uterine contractions every time you breastfeed for up to several weeks postpartum. Uterine contractions help your uterus return to its normal size.  It is normal to have vaginal bleeding (lochia) after delivery. The amount and appearance of lochia is often similar to a menstrual period in the first week after delivery. It will gradually  decrease over the next few weeks to a dry, yellow-brown discharge. For most women, lochia stops completely by 6-8 weeks after delivery. Vaginal bleeding can vary from woman to woman.  Within the first few days after delivery, you may have breast engorgement. This is when your breasts feel heavy, full, and uncomfortable. Your breasts may also throb and feel hard, tightly stretched, warm, and tender. After this occurs, you may have milk leaking from your breasts.Your health care provider can help you relieve discomfort due to breast engorgement. Breast engorgement should go away within a few days.  You may feel more sad or worried than normal due to hormonal changes after delivery. These feelings should not last more than a few days. If these feelings do not go away after several days, speak with your health care provider. How should I care for myself?  Tell your health care provider if you have pain or discomfort.  Drink enough water to keep your urine clear or pale yellow.  Wash your hands thoroughly with soap and water for at least 20 seconds after changing your sanitary pads, after using the toilet, and before holding or feeding your baby.  If you are not breastfeeding, avoid touching your breasts a lot. Doing this can make your breasts produce more milk.  If you become weak or lightheaded, or you feel like you might faint, ask for help before: ? Getting out of bed. ? Showering.  Change your sanitary pads frequently. Watch for any changes in your flow, such as a sudden increase in volume, a change in color, the passing of large blood clots. If you pass a blood clot from your vagina, save it to show to your health care provider.  Do not flush blood clots down the toilet without having your health care provider look at them.  Make sure that all your vaccinations are up to date. This can help protect you and your baby from getting certain diseases. You may need to have immunizations done before  you leave the hospital.  If desired, talk with your health care provider about methods of family planning or birth control (contraception). How can I start bonding with my baby? Spending as much time as possible with your baby is very important. During this time, you and your baby can get to know each other and develop a bond. Having your baby stay with you in your room (rooming in) can give you time to get to know your baby. Rooming in can also help you become comfortable caring for your baby. Breastfeeding can also help you bond with your baby. How can I plan for returning home with my baby?  Make sure that you have a car seat installed in your vehicle. ? Your car seat should be checked by a certified car seat installer to make sure that it is installed safely. ? Make sure that your baby fits into the car seat safely.  Ask your health care provider any questions you have about caring for yourself or your baby. Make sure that you are able to contact your health care provider with any questions after leaving the hospital. This information is not intended to replace advice given to you by your health care provider. Make sure you discuss any questions you have with your health care provider. Document Released: 01/11/2007 Document Revised: 08/19/2015 Document Reviewed: 02/18/2015 Elsevier Interactive Patient Education  [employer]2018 [employer]

## 2017-02-04 NOTE — Discharge Summary (Signed)
OB Discharge Summary     Patient Name: Shelly Watts DOB: 05/30/1995 MRN: [id number]  Date of admission: 02/02/2017 Delivering MD: Shawna ClampBOOKER, KIMBERLY R   Date of discharge: 02/04/2017  Admitting diagnosis: 40 WEEKS CTX ROM Intrauterine pregnancy: 7214w5d     Secondary diagnosis:  Active Problems:   Indication for care in labor or delivery   SVD (spontaneous vaginal delivery)  Additional problems: Patient Active Problem List   Diagnosis Date Noted  . Indication for care in labor or delivery 02/03/2017  . SVD (spontaneous vaginal delivery) 02/03/2017  . Abdominal pain during pregnancy in third trimester 01/27/2017  . 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 [redacted] weeks gestation of pregnancy   . Supervision of normal first pregnancy, antepartum 10/02/2016       Discharge diagnosis: Term Pregnancy Delivered                                                                                                Post partum procedures:none  Augmentation: none  Complications: None  Hospital course:  Onset of Labor With Vaginal Delivery     21 y.o. yo G1P1001 at 3514w5d was admitted in Active Labor on 02/02/2017. Patient had an uncomplicated labor course as follows:  Membrane Rupture Time/Date: 10:30 PM ,02/02/2017   Intrapartum Procedures: Episiotomy: None [1]                                         Lacerations:  2nd degree [3]  Patient had a delivery of a Viable infant. 02/02/2017  Information for the patient's newborn:  Delorise [place]iner[place], Girl Kaitlynne [[id number]][[id number]]  Delivery Method: Vaginal, Spontaneous(Filed from Delivery Summary)    Pateint had an uncomplicated postpartum course.  She is ambulating, tolerating a regular diet, passing flatus, and urinating well. Patient is discharged home in stable condition on 02/04/17.   Physical exam  Vitals:   02/03/17 0521 02/03/17 1300 02/03/17 1805 02/04/17 0516  BP: 107/63 92/65 123/63 (!) 103/53  Pulse: (!) 102 73 (!) 104 77  Resp: 18 20 19 18    Temp: 98.3 F (36.8 C) 97.7 F (36.5 C) 97.6 F (36.4 C)   TempSrc: Oral Oral Oral   Weight:      Height:       General: alert, cooperative and no distress Lochia: appropriate Uterine Fundus: firm Incision: N/A DVT Evaluation: No evidence of DVT seen on physical exam. Labs: Lab Results  Component Value Date   WBC 14.5 (H) 02/03/2017   HGB 13.3 02/03/2017   HCT 36.7 02/03/2017   MCV 92.7 02/03/2017   PLT 253 02/03/2017   CMP Latest Ref Rng & Units 08/15/2014  Glucose 65 - 99 mg/dL 90  BUN 6 - 20 mg/dL 12  Creatinine 4.400.44 - 1.021.00 mg/dL 7.250.93  Sodium 366135 - 440145 mmol/L 137  Potassium 3.5 - 5.1 mmol/L 4.0  Chloride 101 - 111 mmol/L 103  CO2 22 - 32 mmol/L 24  Calcium 8.9 - 10.3 mg/dL 9.1  Total Protein 6.5 - 8.1 g/dL  6.9  Total Bilirubin 0.3 - 1.2 mg/dL 0.4  Alkaline Phos 38 - 126 U/L 94  AST 15 - 41 U/L 19  ALT 14 - 54 U/L 19    Discharge instruction: per After Visit Summary and "Baby and Me Booklet".  After visit meds:  Allergies as of 02/04/2017   No Known Allergies     Medication List    STOP taking these medications   clotrimazole 1 % cream Commonly known as:  LOTRIMIN     TAKE these medications   ibuprofen 600 MG tablet Commonly known as:  ADVIL,MOTRIN Take 1 tablet (600 mg total) every 6 (six) hours by mouth.   Prenatal Vitamin 27-0.8 MG Tabs Take 1 tablet by mouth daily.   senna-docusate 8.6-50 MG tablet Commonly known as:  Senokot-S Take 2 tablets daily by mouth. Start taking on:  02/05/2017       Diet: routine diet  Activity: Advance as tolerated. Pelvic rest for 6 weeks.   Outpatient follow up:4 weeks Follow up Appt: Future Appointments  Date Time Provider Department Center  02/11/2017 12:00 AM [facility] RO[facility] None  03/03/2017  1:30 PM Brock BadHarper, Charles A, MD [facility] None   Follow up Visit:No Follow-up on file.  Postpartum contraception: Nexplanon  Newborn Data: Live born female  Birth Weight: 5 lb 15.9 oz (2719 g) APGAR:  9, 10  Newborn Delivery   Birth date/time:  02/02/2017 23:28:00 Delivery type:  Vaginal, Spontaneous     Baby Feeding: Breast Disposition:home with mother   02/04/2017 Suella BroadKeriann S Minott, MD  I confirm that I have verified the information documented in the resident's note and that I have also personally reperformed the physical exam and all medical decision making activities.  Patient was seen and examined by me also Agree with note Vitals stable Labs stable Fundus firm, lochia within normal limits Perineum healing Ext WNL Continue care Ready for discharge  Aviva SignsWilliams, Marie L, CNM

## 2017-02-04 NOTE — Lactation Note (Signed)
This note was copied from a baby's chart. Lactation Consultation Note  Patient Name: Girl Kathlen Modyriel [place] Today's Date: 02/04/2017 Reason for consult: Follow-up assessment   Mother and baby resting.  Mother agreed to attempt latching for LC to observe feeding. Baby was unwrapped. Mother demonstrated good flow of colostrum via hand expression prior to latch attempt. Mother placed baby is cradle hold but baby was too sleepy to latch. LC left phone number and suggest mother call when baby is cueing.     Maternal Data Formula Feeding for Exclusion: No Has patient been taught Hand Expression?: Yes Does the patient have breastfeeding experience prior to this delivery?: No  Feeding    LATCH Score                   Interventions    Lactation Tools Discussed/Used WIC Program: Yes   Consult Status Consult Status: Complete    Hardie PulleyBerkelhammer, Ruth Boschen 02/04/2017, 10:52 AM

## 2017-02-11 ENCOUNTER — Inpatient Hospital Stay (HOSPITAL_COMMUNITY): Payer: Medicaid Other

## 2017-03-03 ENCOUNTER — Ambulatory Visit: Payer: Medicaid Other | Admitting: Obstetrics

## 2017-03-09 ENCOUNTER — Ambulatory Visit: Payer: Medicaid Other | Admitting: Obstetrics

## 2017-03-15 ENCOUNTER — Encounter: Payer: Self-pay | Admitting: Obstetrics

## 2017-03-15 ENCOUNTER — Ambulatory Visit (INDEPENDENT_AMBULATORY_CARE_PROVIDER_SITE_OTHER): Payer: Medicaid Other | Admitting: Obstetrics

## 2017-03-15 DIAGNOSIS — Z1389 Encounter for screening for other disorder: Secondary | ICD-10-CM | POA: Diagnosis not present

## 2017-03-15 DIAGNOSIS — Z3009 Encounter for other general counseling and advice on contraception: Secondary | ICD-10-CM

## 2017-03-15 DIAGNOSIS — Z30017 Encounter for initial prescription of implantable subdermal contraceptive: Secondary | ICD-10-CM

## 2017-03-15 NOTE — Progress Notes (Signed)
Post Partum Exam  Shelly Watts is a 21 y.o. 301P1001 female who presents for a postpartum visit. She is 6 weeks postpartum following a spontaneous vaginal delivery. I have fully reviewed the prenatal and intrapartum course. The delivery was at 39w 4d gestational weeks.  Anesthesia: none and per pt delivery happen so fast. Postpartum course has been unremarkable. Baby's course has been unremarkable. Baby is feeding by breast. Bleeding red. Bowel function is normal. Bladder function is normal. Patient is not sexually active. Contraception method is Nexplanon. Postpartum depression screening:neg EPDS 0  The following portions of the patient's history were reviewed and updated as appropriate: allergies, current medications, past family history, past medical history, past social history, past surgical history and problem list.  Review of Systems A comprehensive review of systems was negative.    Objective:  Weight 248 lb 4.8 oz (112.6 kg), last menstrual period 05/02/2016, currently breastfeeding.  General:  alert and no distress   Breasts:  inspection negative, no nipple discharge or bleeding, no masses or nodularity palpable  Lungs: clear to auscultation bilaterally  Heart:  regular rate and rhythm, S1, S2 normal, no murmur, click, rub or gallop  Abdomen: soft, non-tender; bowel sounds normal; no masses,  no organomegaly   Vulva:  normal  Vagina: normal vagina, no discharge, exudate, lesion, or erythema  Cervix:  no lesions  Corpus: normal size, contour, position, consistency, mobility, non-tender  Adnexa:  no mass, fullness, tenderness  Rectal Exam: Not performed.        Assessment:    1. Postpartum care following vaginal delivery - doing well  2. Encounter for other general counseling and advice on contraception - wants Nexplanon  3. Encounter for initial prescription of implantable subdermal contraceptive   Plan:   1. Contraception: Nexplanon 2. Nexplanon Rx 3. Follow up in:  3 days or as needed. Nexplanon Insertion

## 2017-03-18 ENCOUNTER — Encounter: Payer: Self-pay | Admitting: Obstetrics

## 2017-03-18 ENCOUNTER — Ambulatory Visit (INDEPENDENT_AMBULATORY_CARE_PROVIDER_SITE_OTHER): Payer: Medicaid Other | Admitting: Obstetrics

## 2017-03-18 VITALS — BP 105/68 | HR 80 | Ht 60.0 in | Wt 247.7 lb

## 2017-03-18 DIAGNOSIS — Z3049 Encounter for surveillance of other contraceptives: Secondary | ICD-10-CM | POA: Diagnosis not present

## 2017-03-18 DIAGNOSIS — Z3202 Encounter for pregnancy test, result negative: Secondary | ICD-10-CM | POA: Diagnosis not present

## 2017-03-18 DIAGNOSIS — Z30017 Encounter for initial prescription of implantable subdermal contraceptive: Secondary | ICD-10-CM

## 2017-03-18 LAB — POCT URINE PREGNANCY: PREG TEST UR: NEGATIVE

## 2017-03-18 MED ORDER — ETONOGESTREL 68 MG ~~LOC~~ IMPL
68.0000 mg | DRUG_IMPLANT | Freq: Once | SUBCUTANEOUS | Status: AC
  Administered 2017-03-18: 68 mg via SUBCUTANEOUS

## 2017-03-18 NOTE — Progress Notes (Signed)
Nexplanon Procedure Note   PRE-OP DIAGNOSIS: desired long-term, reversible contraception  POST-OP DIAGNOSIS: Same  PROCEDURE: Nexplanon  placement Performing Provider: Brock BadHARLES A. Estell Dillinger MD  Patient education prior to procedure, explained risk, benefits of Nexplanon, reviewed alternative options. Patient reported understanding. Gave consent to continue with procedure.   PROCEDURE:  Pregnancy Text :  Negative Site (check):      left arm         Sterile Preparation:   Betadinex3 Lot # [id number] Expiration Date 05 / 2021  Insertion site was selected 8 - 10 cm from medial epicondyle and marked along with guiding site using sterile marker. Procedure area was prepped and draped in a sterile fashion. 1% Lidocaine 1.5 ml given prior to procedure. Nexplanon  was inserted subcutaneously.Needle was removed from the insertion site. Nexplanon capsule was palpated by provider and patient to assure satisfactory placement. Dressing applied.  Followup: The patient tolerated the procedure well without complications.  Standard post-procedure care is explained and return precautions are given.  Brock BadHARLES A. Adrielle Polakowski MD

## 2017-03-18 NOTE — Progress Notes (Signed)
Presents for Nexplanon Insertion.  UPT today is NEGATIVE.  Administrations This Visit    etonogestrel (NEXPLANON) implant 68 mg    Admin Date 03/18/2017 Action Given Dose 68 mg Route Subdermal Administered By Maretta BeesMcGlashan, Ayasha Ellingsen J, RMA

## 2017-03-24 ENCOUNTER — Encounter: Payer: Self-pay | Admitting: *Deleted

## 2017-04-01 ENCOUNTER — Ambulatory Visit (INDEPENDENT_AMBULATORY_CARE_PROVIDER_SITE_OTHER): Payer: Medicaid Other | Admitting: Obstetrics

## 2017-04-01 ENCOUNTER — Encounter: Payer: Self-pay | Admitting: Obstetrics

## 2017-04-01 VITALS — BP 99/67 | HR 98 | Wt 247.0 lb

## 2017-04-01 DIAGNOSIS — Z3046 Encounter for surveillance of implantable subdermal contraceptive: Secondary | ICD-10-CM

## 2017-04-01 DIAGNOSIS — Z3049 Encounter for surveillance of other contraceptives: Secondary | ICD-10-CM

## 2017-04-01 NOTE — Progress Notes (Signed)
Subjective:    Shelly Watts is a 22 y.o. female who presents for follow up after Nexplanon Insertion. The patient has no complaints today. The patient is sexually active. Pertinent past medical history: none.  The information documented in the HPI was reviewed and verified.  Menstrual History: OB History    Gravida Para Term Preterm AB Living   1 1 1     1    SAB TAB Ectopic Multiple Live Births         0 1       Patient's last menstrual period was 03/14/2017 (exact date).   Patient Active Problem List   Diagnosis Date Noted  . Indication for care in labor or delivery 02/03/2017  . SVD (spontaneous vaginal delivery) 02/03/2017  . Abdominal pain during pregnancy in third trimester 01/27/2017  . 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 [redacted] weeks gestation of pregnancy   . Supervision of normal first pregnancy, antepartum 10/02/2016   Past Medical History:  Diagnosis Date  . Headache     Past Surgical History:  Procedure Laterality Date  . NO PAST SURGERIES       Current Outpatient Medications:  .  ibuprofen (ADVIL,MOTRIN) 600 MG tablet, Take 1 tablet (600 mg total) every 6 (six) hours by mouth., Disp: 30 tablet, Rfl: 0 .  Prenatal Vit-Fe Fumarate-FA (PRENATAL VITAMIN) 27-0.8 MG TABS, Take 1 tablet by mouth daily., Disp: 90 tablet, Rfl: 5 No Known Allergies  Social History   Tobacco Use  . Smoking status: Never Smoker  . Smokeless tobacco: Never Used  Substance Use Topics  . Alcohol use: No    Comment: none since posiive  pregnancy test    Family History  Problem Relation Age of Onset  . Miscarriages / [place] Mother   . Asthma Brother   . Heart disease Maternal Grandmother   . Hypertension Maternal Grandmother   . Diabetes Maternal Grandfather   . Hypertension Maternal Grandfather        Review of Systems Constitutional: negative for weight loss Genitourinary:negative for abnormal menstrual periods and vaginal discharge   Objective:   BP  99/67   Pulse 98   Wt 247 lb (112 kg)   LMP 03/14/2017 (Exact Date)   Breastfeeding? Yes   BMI 48.24 kg/m    General:   alert  Skin:   no rash or abnormalities  Lungs:   clear to auscultation bilaterally  Heart:   regular rate and rhythm, S1, S2 normal, no murmur, click, rub or gallop  Breasts:   normal without suspicious masses, skin or nipple changes or axillary nodes  Abdomen:  normal findings: no organomegaly, soft, non-tender and no hernia  Pelvis:  External genitalia: normal general appearance Urinary system: urethral meatus normal and bladder without fullness, nontender Vaginal: normal without tenderness, induration or masses Cervix: normal appearance Adnexa: normal bimanual exam Uterus: anteverted and non-tender, normal size   Lab Review Urine pregnancy test Labs reviewed yes Radiologic studies reviewed no  50% of 15 min visit spent on counseling and coordination of care.    Assessment:    22 y.o., continuing Nexplanon, no contraindications.   Plan:    All questions answered. Discussed healthy lifestyle modifications. Follow up in 1 year. No orders of the defined types were placed in this encounter.  No orders of the defined types were placed in this encounter.

## 2017-04-01 NOTE — Progress Notes (Signed)
Pt has no complaints with nexplanon

## 2019-05-04 IMAGING — US US MFM OB DETAIL+14 WK
1 series · 14 of 28 positions shown · non-contrast
Comparison: none

[Series 1: us mfm ob detail+14 wk · 14 of 123 slices shown]
[im 5/123]
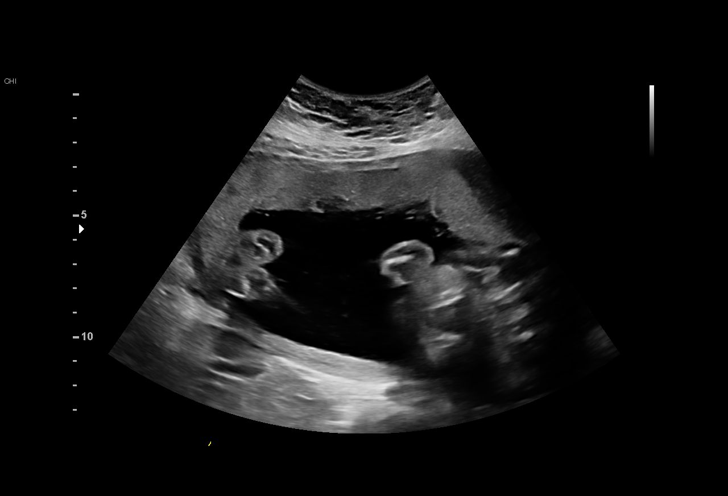
[im 14/123]
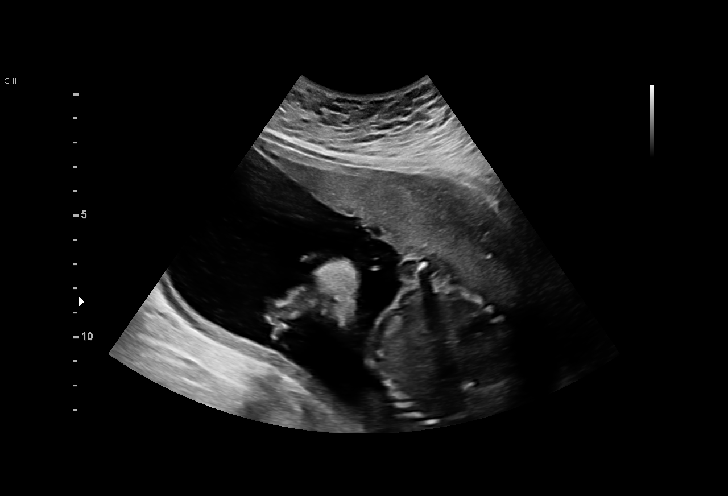
[im 23/123]
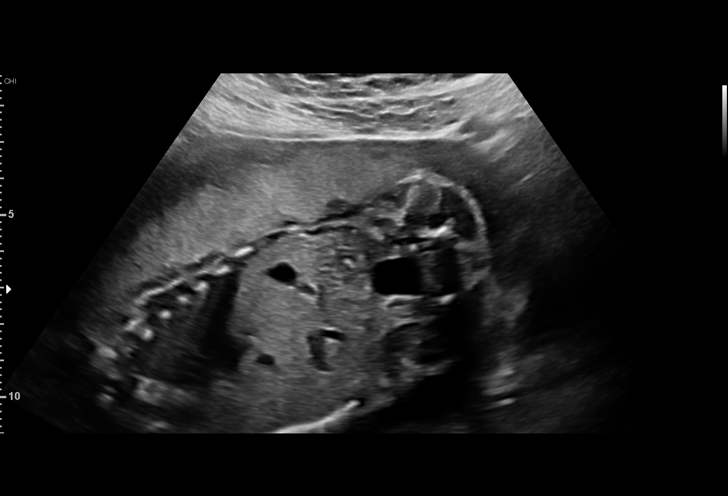
[im 32/123]
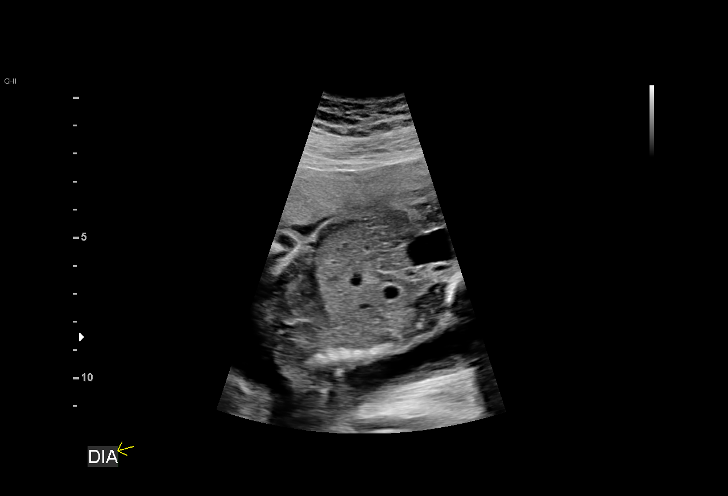
[im 41/123]
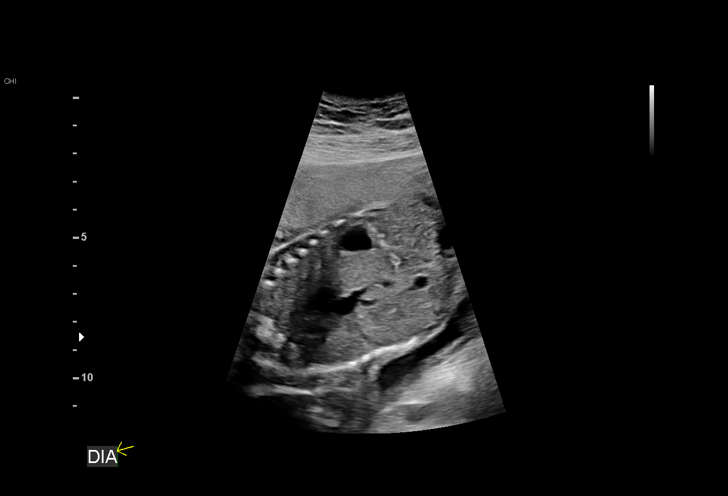
[im 50/123]
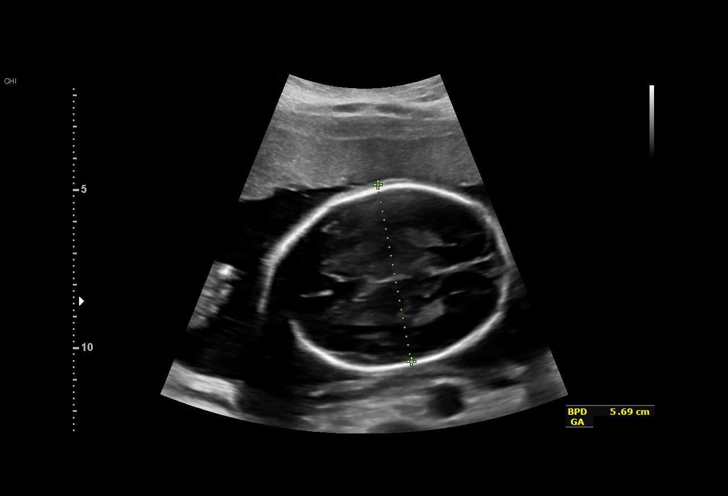
[im 59/123]
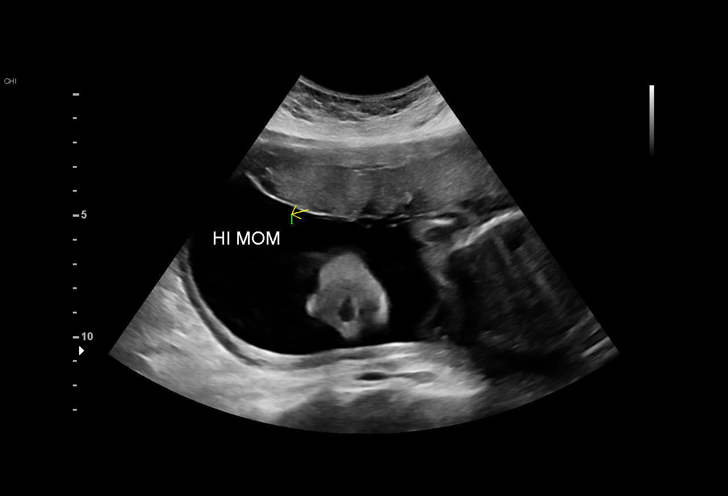
[im 68/123]
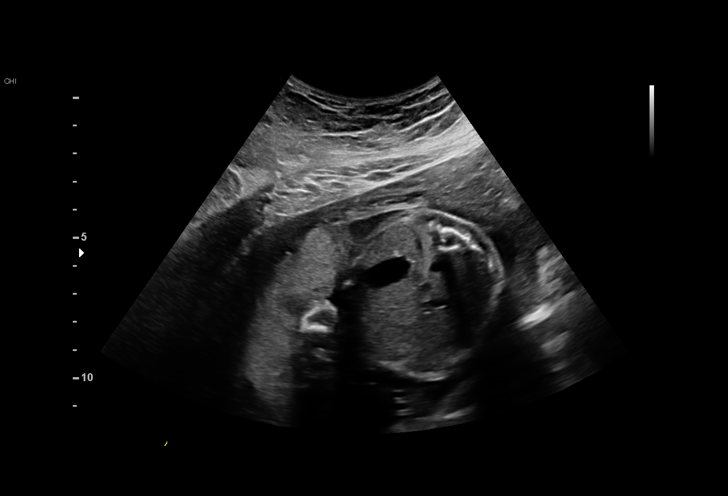
[im 77/123]
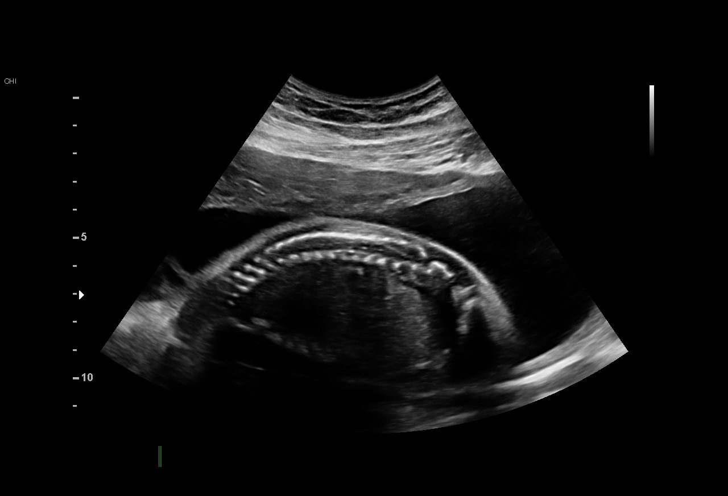
[im 86/123]
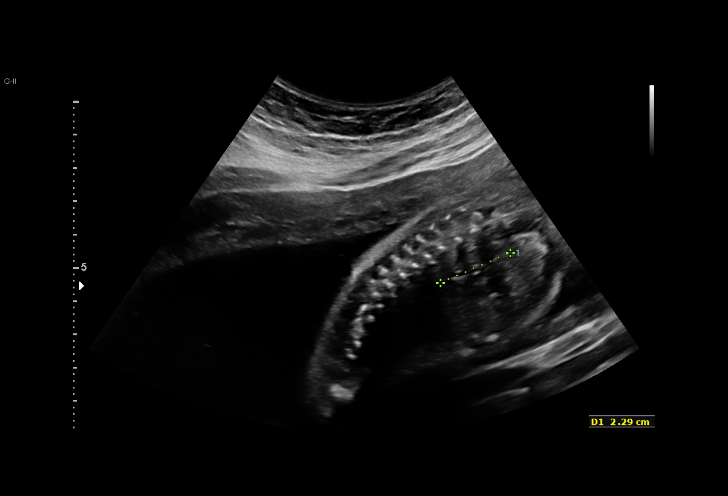
[im 95/123]
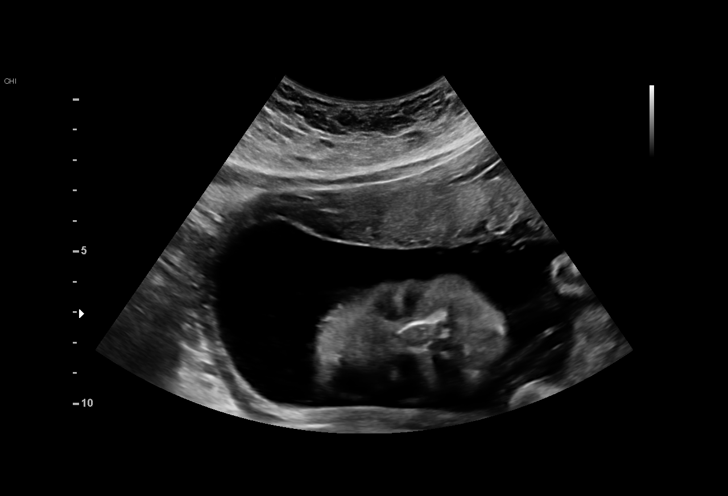
[im 104/123]
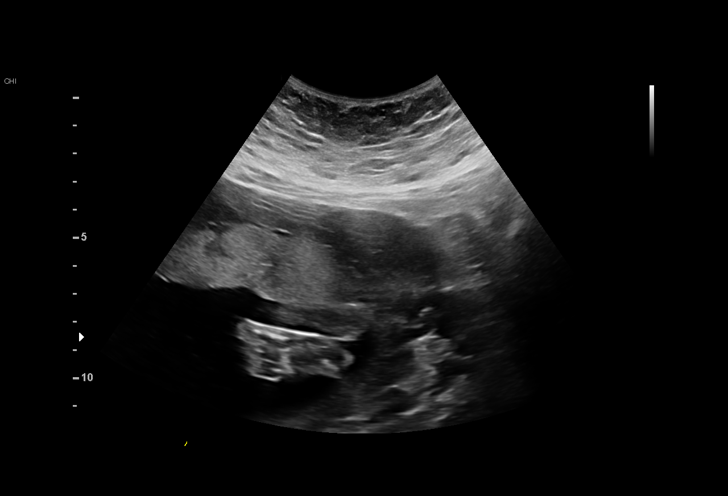
[im 113/123]
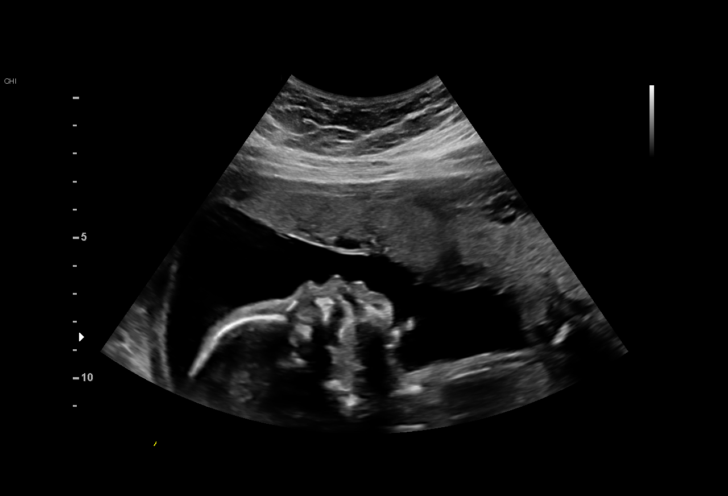
[im 123/123]
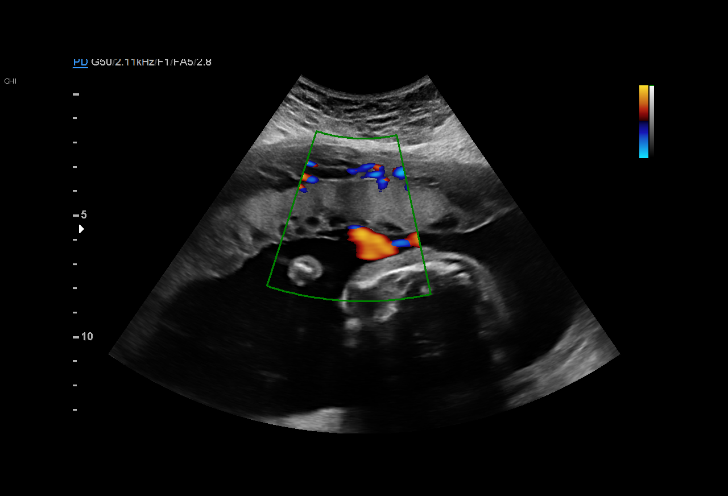

[14 of 28 positions shown; findings below may reference images not displayed]

1  KATIBE GAMSIZ           [id number]      [id number]     [id number]
Indications

23 weeks gestation of pregnancy
Obesity complicating pregnancy, second
trimester
Encounter for fetal anatomic survey
OB History

Blood Type:            Height:         Weight (lb):  250      BMI:
Gravidity:    1
Fetal Evaluation

Num Of Fetuses:     1
Fetal Heart         158
Rate(bpm):
Cardiac Activity:   Observed
Presentation:       Breech
Placenta:           Anterior, above cervical os
P. Cord Insertion:  Visualized, central

Amniotic Fluid
AFI FV:      Subjectively within normal limits
Biometry

BPD:      56.4  mm     G. Age:  23w 2d         31  %    CI:        69.69   %   70 - 86
FL/HC:      19.2   %   18.7 -
HC:      215.6  mm     G. Age:  23w 4d         36  %    HC/AC:      1.14       1.05 -
AC:      189.6  mm     G. Age:  23w 5d         46  %    FL/BPD:     73.4   %   71 - 87
FL:       41.4  mm     G. Age:  23w 3d         34  %    FL/AC:      21.8   %   20 - 24
HUM:      38.2  mm     G. Age:  23w 4d         40  %
CER:      24.3  mm     G. Age:  22w 2d         26  %
CM:        4.5  mm
Est. FW:     608  gm      1 lb 5 oz     53  %
Gestational Age

U/S Today:     23w 4d                                        EDD:   02/04/17
Best:          23w 4d    Det. By:   U/S (10/12/16)           EDD:   02/04/17
Anatomy

Cranium:               Appears normal         Aortic Arch:            Appears normal
Cavum:                 Appears normal         Ductal Arch:            Not well visualized
Ventricles:            Appears normal         Diaphragm:              Appears normal
Choroid Plexus:        Appears normal         Stomach:                Appears normal, left
sided
Cerebellum:            Appears normal         Abdomen:                Appears normal
Posterior Fossa:       Appears normal         Abdominal Wall:         Appears nml (cord
insert, abd wall)
Nuchal Fold:           Not applicable (>20    Cord Vessels:           Appears normal (3
wks GA)                                        vessel cord)
Face:                  Appears normal         Kidneys:                Appear normal
(orbits and profile)
Lips:                  Appears normal         Bladder:                Appears normal
Thoracic:              Appears normal         Spine:                  Appears normal
Heart:                 Appears normal         Upper Extremities:      Appears normal
(4CH, axis, and situs
RVOT:                  Not well visualized    Lower Extremities:      Appears normal
LVOT:                  Appears normal

Other:  Heels and 5th digit appears normal. Fetus appears to be a female.
Cervix Uterus Adnexa

Cervix
Length:            4.1  cm.
Normal appearance by transabdominal scan.

Uterus
No abnormality visualized.

Left Ovary
Within normal limits.

Right Ovary
Within normal limits.

Cul De Sac:   No free fluid seen.

Adnexa:       No abnormality visualized.
Impression

SIUP at 23+4 weeks
Normal detailed fetal anatomy; limited views of RVOT and DA
Normal amniotic fluid volume
EDC based on today's measurements: 02/04/17
Recommendations

Follow-up as clinically indicated or follow-up ultrasound in 4-6
weeks to complete anatomy survey

## 2019-07-22 IMAGING — US US MFM OB FOLLOW-UP
1 series · 14 of 28 positions shown · non-contrast
Comparison: none

[Series 1: us mfm ob follow-up · 28 acquisitions, 14 frames shown]
[im 2/28]
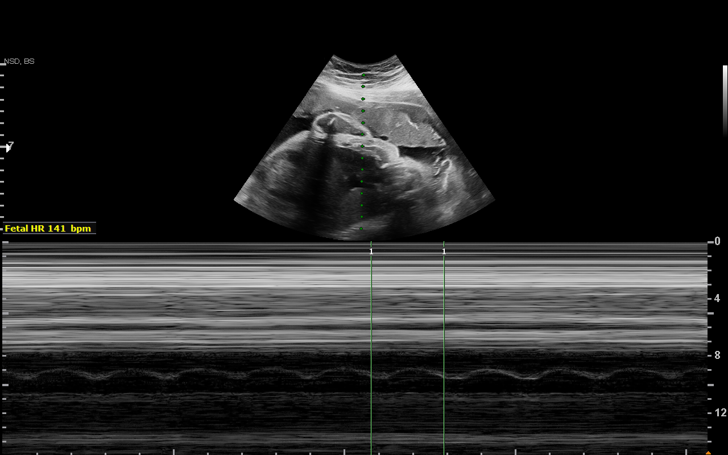
[im 4/28]
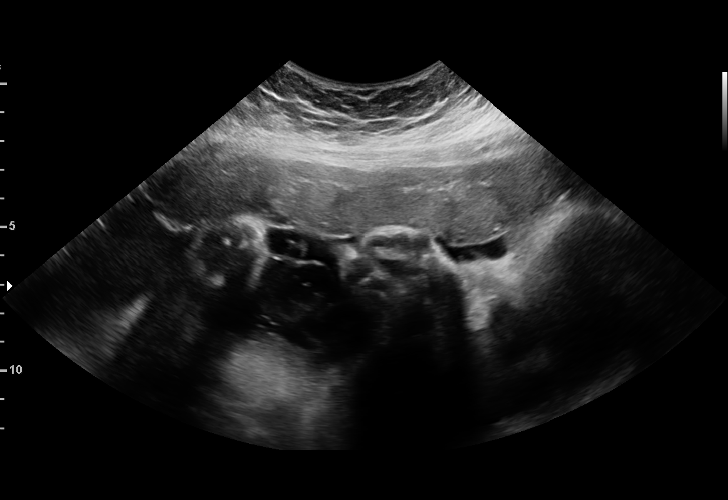
[im 6/28]
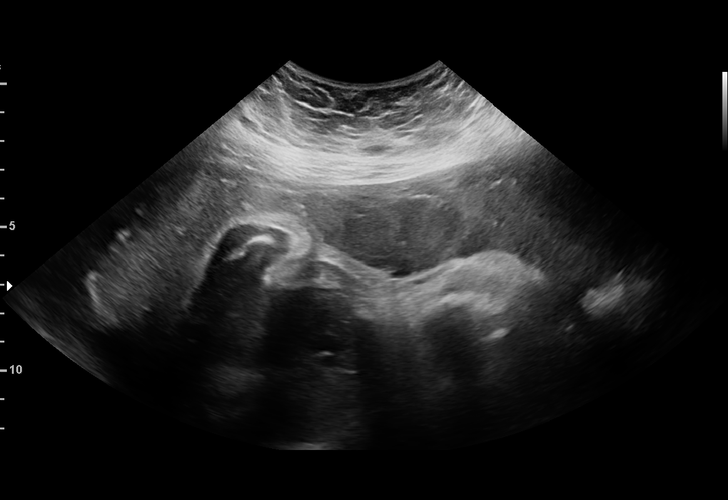
[im 8/28]
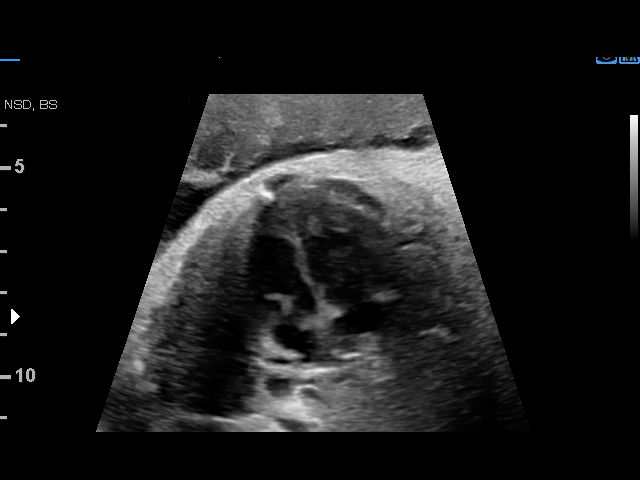
[im 10/28]
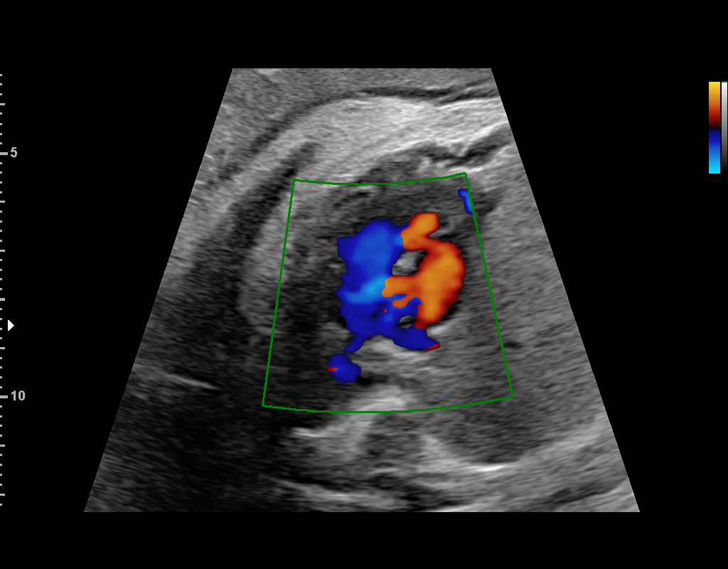
[im 12/28]
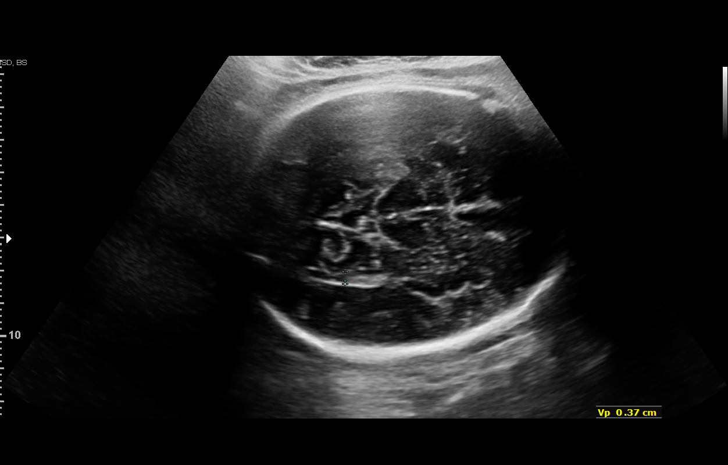
[im 14/28]
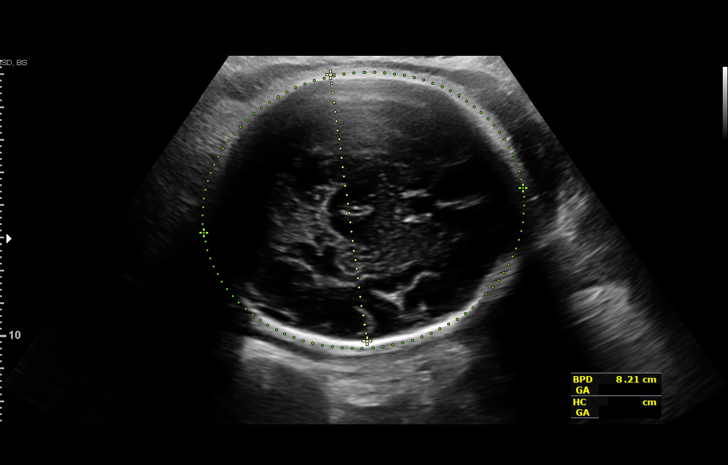
[im 16/28]
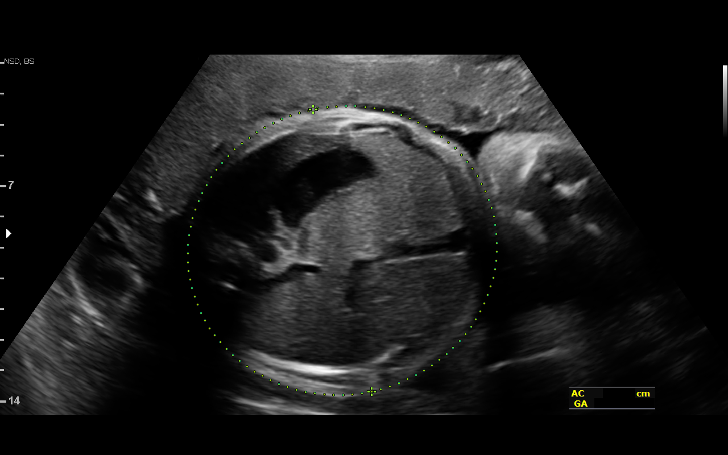
[im 18/28]
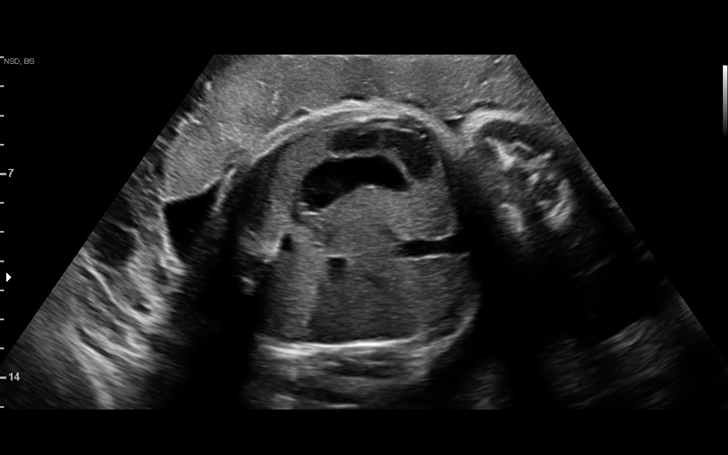
[im 20/28]
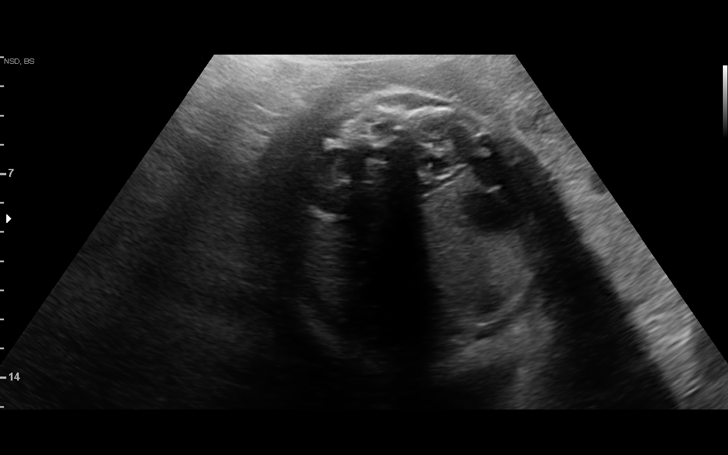
[im 22/28]
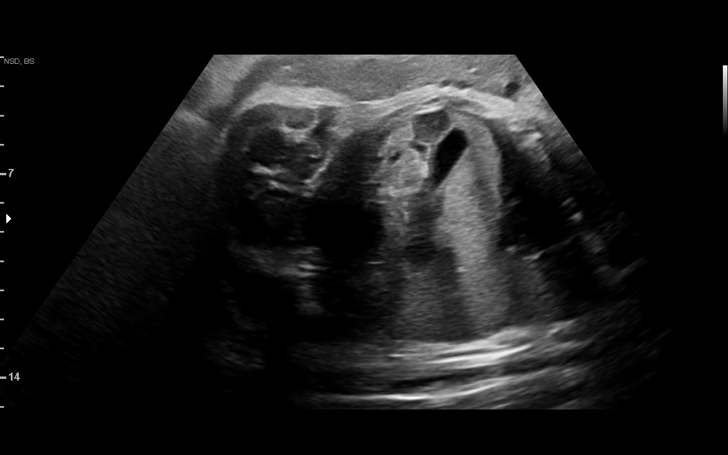
[im 24/28]
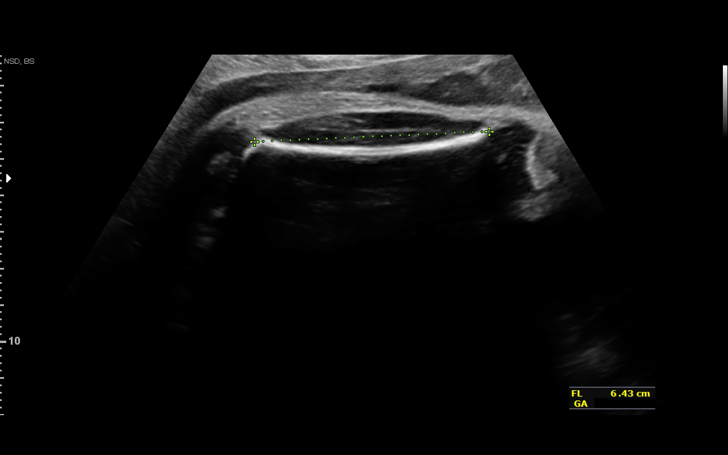
[im 26/28]
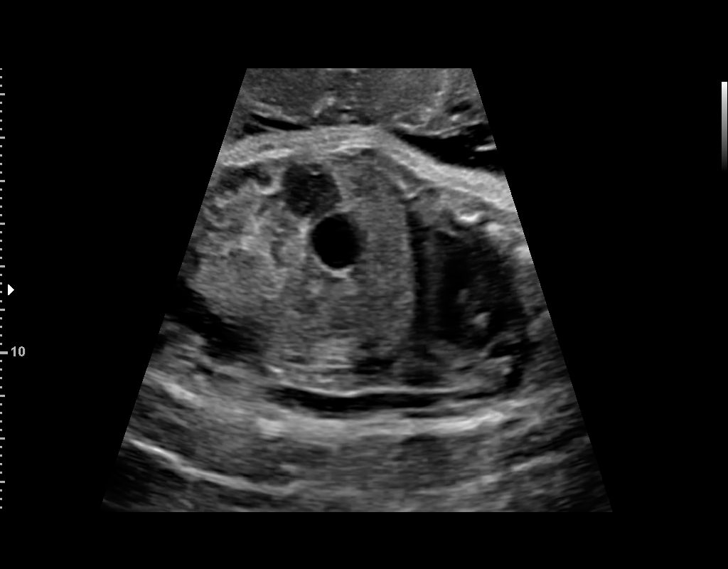
[im 28/28]
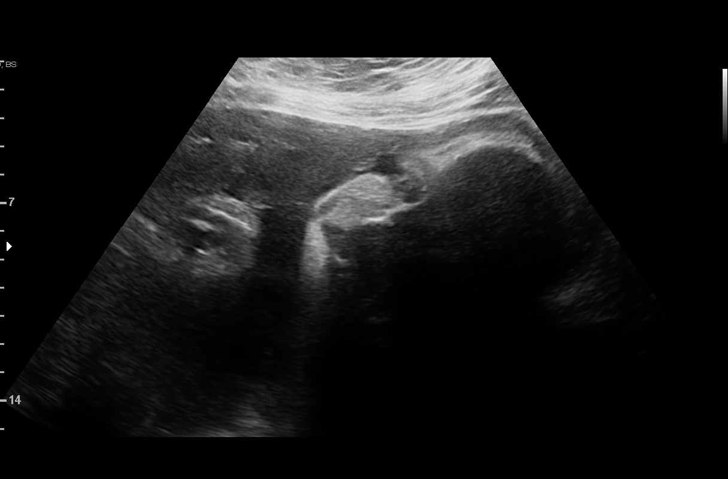

[14 of 28 positions shown; findings below may reference images not displayed]

1  PEDRO PAIVA ERCEGO           [id number]      [id number]     [id number]
Indications

34 weeks gestation of pregnancy
Obesity complicating pregnancy, third
trimester
Encounter for other antenatal screening
follow-up
OB History

Blood Type:            Height:         Weight (lb):  250       BMI:
Gravidity:    1
Fetal Evaluation

Num Of Fetuses:     1
Fetal Heart         141
Rate(bpm):
Cardiac Activity:   Observed
Presentation:       Cephalic
Placenta:           Anterior, above cervical os
P. Cord Insertion:  Previously seen as normal

Amniotic Fluid
AFI FV:      Subjectively within normal limits

AFI Sum(cm)     %Tile       Largest Pocket(cm)
20.55           77

RUQ(cm)       RLQ(cm)       LUQ(cm)        LLQ(cm)
6.82
Biometry
BPD:      81.9  mm     G. Age:  33w 0d          7  %    CI:         76.9   %    70 - 86
FL/HC:      21.8   %    20.1 -
HC:      295.8  mm     G. Age:  32w 5d        < 3  %    HC/AC:      0.97        0.93 -
AC:      305.4  mm     G. Age:  34w 4d         46  %    FL/BPD:     78.8   %    71 - 87
FL:       64.5  mm     G. Age:  33w 2d         10  %    FL/AC:      21.1   %    20 - 24

Est. FW:    2232  gm           5 lb     40  %
Gestational Age

U/S Today:     33w 3d                                        EDD:   02/14/17
Best:          34w 6d     Det. By:  U/S  (10/12/16)          EDD:   02/04/17
Anatomy

Cranium:               Appears normal         Aortic Arch:            Previously seen
Cavum:                 Previously seen        Ductal Arch:            Not well visualized
Ventricles:            Appears normal         Diaphragm:              Appears normal
Choroid Plexus:        Previously seen        Stomach:                Appears normal, left
sided
Cerebellum:            Previously seen        Abdomen:                Appears normal
Posterior Fossa:       Previously seen        Abdominal Wall:         Previously seen
Nuchal Fold:           Not applicable (>20    Cord Vessels:           Previously seen
wks GA)
Face:                  Orbits and profile     Kidneys:                Appear normal
previously seen
Lips:                  Previously seen        Bladder:                Appears normal
Thoracic:              Appears normal         Spine:                  Previously seen
Heart:                 Appears normal         Upper Extremities:      Previously seen
(4CH, axis, and situs
RVOT:                  Appears normal         Lower Extremities:      Previously seen
LVOT:                  Previously seen

Other:  Female gender previously seen. Heels and 5th digit previously seen.
Technically difficult due to maternal habitus and fetal position.
Cervix Uterus Adnexa

Cervix
Not visualized (advanced GA >51wks)

Left Ovary
Previously seen.

Right Ovary
Previously seen
Impression

SIUP at 34+6 weeks
Normal interval anatomy; anatomic survey complete
Normal amniotic fluid volume
Appropriate interval growth with EFW at the 40th %tile
Recommendations

Follow-up as clinically indicated
